# Patient Record
Sex: Female | Born: 1960
Health system: Southern US, Community
[De-identification: ages and names within clinical notes are randomized; demographics above are authoritative.]

## PROBLEM LIST (undated history)

## (undated) ENCOUNTER — Ambulatory Visit (HOSPITAL_COMMUNITY): Admission: EM | Payer: Commercial Managed Care - HMO

## (undated) DIAGNOSIS — F329 Major depressive disorder, single episode, unspecified: Secondary | ICD-10-CM

## (undated) DIAGNOSIS — F32A Depression, unspecified: Secondary | ICD-10-CM

## (undated) HISTORY — DX: Depression, unspecified: F32.A

---
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
## 1993-08-25 HISTORY — PX: TUBAL LIGATION: SHX77

## 2008-07-04 ENCOUNTER — Ambulatory Visit: Payer: Self-pay | Admitting: Sports Medicine

## 2008-07-04 DIAGNOSIS — M25569 Pain in unspecified knee: Secondary | ICD-10-CM | POA: Insufficient documentation

## 2008-07-04 DIAGNOSIS — S336XXA Sprain of sacroiliac joint, initial encounter: Secondary | ICD-10-CM | POA: Insufficient documentation

## 2009-09-20 ENCOUNTER — Emergency Department (HOSPITAL_COMMUNITY): Admission: EM | Admit: 2009-09-20 | Discharge: 2009-09-20 | Payer: Self-pay | Admitting: Emergency Medicine

## 2009-10-01 ENCOUNTER — Ambulatory Visit (HOSPITAL_COMMUNITY): Admission: RE | Admit: 2009-10-01 | Discharge: 2009-10-01 | Payer: Self-pay | Admitting: Occupational Medicine

## 2011-03-10 ENCOUNTER — Inpatient Hospital Stay (INDEPENDENT_AMBULATORY_CARE_PROVIDER_SITE_OTHER)
Admission: RE | Admit: 2011-03-10 | Discharge: 2011-03-10 | Disposition: A | Discharge status: Home / Self Care | Payer: 59 | Source: Ambulatory Visit | Attending: Emergency Medicine | Admitting: Emergency Medicine

## 2011-03-10 DIAGNOSIS — R198 Other specified symptoms and signs involving the digestive system and abdomen: Secondary | ICD-10-CM

## 2011-03-10 DIAGNOSIS — M62838 Other muscle spasm: Secondary | ICD-10-CM

## 2011-03-10 DIAGNOSIS — F411 Generalized anxiety disorder: Secondary | ICD-10-CM

## 2011-12-30 ENCOUNTER — Encounter (HOSPITAL_COMMUNITY): Payer: Self-pay | Admitting: *Deleted

## 2011-12-30 ENCOUNTER — Emergency Department (HOSPITAL_COMMUNITY): Payer: Self-pay

## 2011-12-30 ENCOUNTER — Emergency Department (HOSPITAL_COMMUNITY)
Admission: EM | Admit: 2011-12-30 | Discharge: 2011-12-30 | Disposition: A | Discharge status: Home / Self Care | Payer: Self-pay | Attending: Emergency Medicine | Admitting: Emergency Medicine

## 2011-12-30 DIAGNOSIS — R109 Unspecified abdominal pain: Secondary | ICD-10-CM | POA: Insufficient documentation

## 2011-12-30 DIAGNOSIS — N949 Unspecified condition associated with female genital organs and menstrual cycle: Secondary | ICD-10-CM | POA: Insufficient documentation

## 2011-12-30 DIAGNOSIS — R10814 Left lower quadrant abdominal tenderness: Secondary | ICD-10-CM | POA: Insufficient documentation

## 2011-12-30 DIAGNOSIS — R42 Dizziness and giddiness: Secondary | ICD-10-CM | POA: Insufficient documentation

## 2011-12-30 LAB — CBC
HCT: 39.4 % (ref 36.0–46.0)
Hemoglobin: 13.3 g/dL (ref 12.0–15.0)
MCH: 30.4 pg (ref 26.0–34.0)
MCV: 90 fL (ref 78.0–100.0)
WBC: 9.2 10*3/uL (ref 4.0–10.5)

## 2011-12-30 LAB — DIFFERENTIAL
Eosinophils Absolute: 0.5 10*3/uL (ref 0.0–0.7)
Eosinophils Relative: 6 % — ABNORMAL HIGH (ref 0–5)
Lymphocytes Relative: 25 % (ref 12–46)
Lymphs Abs: 2.3 10*3/uL (ref 0.7–4.0)
Neutro Abs: 5.6 10*3/uL (ref 1.7–7.7)

## 2011-12-30 LAB — GLUCOSE, CAPILLARY

## 2011-12-30 LAB — BASIC METABOLIC PANEL
CO2: 27 mEq/L (ref 19–32)
Calcium: 8.7 mg/dL (ref 8.4–10.5)
Chloride: 102 mEq/L (ref 96–112)
GFR calc Af Amer: >90 mL/min (ref >90)
Glucose, Bld: 97 mg/dL (ref 70–99)
Potassium: 4.1 mEq/L (ref 3.5–5.1)
Sodium: 138 mEq/L (ref 135–145)

## 2011-12-30 LAB — URINALYSIS, ROUTINE W REFLEX MICROSCOPIC
Bilirubin Urine: NEGATIVE
Hgb urine dipstick: NEGATIVE
Ketones, ur: NEGATIVE mg/dL
Nitrite: NEGATIVE
Protein, ur: NEGATIVE mg/dL
Specific Gravity, Urine: 1.02 (ref 1.005–1.030)
pH: 7.5 (ref 5.0–8.0)

## 2011-12-30 MED ORDER — HYDROCODONE-ACETAMINOPHEN 5-500 MG PO TABS
1.0000 | ORAL_TABLET | Freq: Four times a day (QID) | ORAL | Status: AC | PRN
Start: 2011-12-30 — End: 2012-01-09

## 2011-12-30 MED ORDER — SODIUM CHLORIDE 0.9 % IV BOLUS (SEPSIS)
1000.0000 mL | Freq: Once | INTRAVENOUS | Status: AC
  Administered 2011-12-30: 500 mL via INTRAVENOUS

## 2011-12-30 MED ORDER — FENTANYL CITRATE 0.05 MG/ML IJ SOLN
50.0000 ug | Freq: Once | INTRAMUSCULAR | Status: AC
  Administered 2011-12-30: 50 ug via INTRAVENOUS
  Filled 2011-12-30: qty 2

## 2011-12-30 NOTE — ED Notes (Addendum)
Pt in c/o intermittent dizziness over last year, worse recently and states she has been very fatigued, weakness, increased urination, pain with urination and left flank pain

## 2011-12-30 NOTE — Discharge Instructions (Signed)
Alternate ibuprofen and hydrocodone-acetaminophen as needed for pain. Do not drive or operate machinery with hydrocodone-acetaminophen use. It is very important to followup with primary care provider for further evaluation and management of recurrent flank pain as well as for ongoing dizziness. Return to emergency department for any emergent changing or worsening of symptoms.  Flank Pain Flank pain is pain in your side.  HOME CARE  Home care and treatment will depend on the cause of your pain.   Some medicines can help relieve the pain. Take all medicine as told by your doctor.   Tell your doctor about any changes in your pain.   Follow up with your doctor.  GET HELP RIGHT AWAY IF:   Your pain does not get better with medicine.   The pain gets worse.   You have belly (abdominal) pain.   You are short of breath.   You always feel sick to your stomach (nauseous).   You keep throwing up (vomiting).   You have puffiness (swelling) in your belly.   You pass out (faint).   You have a temperature by mouth above 102 F (38.9 C), not controlled by medicine.  MAKE SURE YOU:   Understand these instructions.   Will watch your condition.   Will get help right away if you are not doing well or get worse.  Document Released: 05/20/2008 Document Revised: 07/31/2011 Document Reviewed: 01/26/2010 [facility] Patient Information 2012 [place], [place].

## 2011-12-30 NOTE — ED Provider Notes (Signed)
History     CSN: [id number]  Arrival date & time 12/30/11  1533   First MD Initiated Contact with Patient 12/30/11 1708      Chief Complaint  Patient presents with  . Dizziness  . Flank Pain    (Consider location/radiation/quality/duration/timing/severity/associated sxs/prior treatment) HPI  Patient presents to ER complaining of a multiple month hx of lightheadedness that she states is worse with quick movement as well as a few day hx of increasing fatigue and mild painful urination stating "it burns a little when I pee" as well as a few day hx of intermittent LLQ/flank pain. Patient states she went to an [facility] last week and started on metronidazole for abdominal pain. Pateitn states she has taken tramadol for pain with some relief of pain. She states she has no known medical problems and takes no meds on a regular basis. She denies hx of abdominal surgeries. She denies known fevers, chills, HA, difficulty ambulating, loss of coordination, blood in stool, hematuria, CP, SOB, n/v/d, vaginal d/c.    History reviewed. No pertinent past medical history.  History reviewed. No pertinent past surgical history.  History reviewed. No pertinent family history.  History  Substance Use Topics  . Smoking status: Not on file  . Smokeless tobacco: Not on file  . Alcohol Use: Not on file    OB History    Grav Para Term Preterm Abortions TAB SAB Ect Mult Living                  Review of Systems  All other systems reviewed and are negative.    Allergies  Review of patient's allergies indicates no known allergies.  Home Medications   Current Outpatient Rx  Name Route Sig Dispense Refill  . IBUPROFEN 400 MG PO TABS Oral Take 400 mg by mouth every 6 (six) hours as needed. For pain relief    . ADULT MULTIVITAMIN W/MINERALS CH Oral Take 1 tablet by mouth daily.    . TRAMADOL HCL 50 MG PO TABS Oral Take 50 mg by mouth every 6 (six) hours as needed. For pain.       BP 110/81  Pulse 86   Temp(Src) 98.5 F (36.9 C) (Oral)  Resp 20  SpO2 100%  LMP 12/08/2011  Physical Exam  Nursing note and vitals reviewed. Constitutional: She is oriented to person, place, and time. She appears well-developed and well-nourished. No distress.  HENT:  Head: Normocephalic and atraumatic.  Eyes: Conjunctivae and EOM are normal. Pupils are equal, round, and reactive to light.       No nystagmus.   Neck: Normal range of motion. Neck supple.       No bruit  Cardiovascular: Normal rate, regular rhythm, normal heart sounds and intact distal pulses.  Exam reveals no gallop and no friction rub.   No murmur heard. Pulmonary/Chest: Effort normal and breath sounds normal. No respiratory distress. She has no wheezes. She has no rales. She exhibits no tenderness.  Abdominal: Soft. Bowel sounds are normal. She exhibits no distension and no mass. There is tenderness. There is no rebound and no guarding.       Mld TTP of LLQ and mild left CVA TTP but no rigidity or peritoneal signs. No skin changes or rash.   Musculoskeletal: Normal range of motion. She exhibits no edema and no tenderness.  Neurological: She is alert and oriented to person, place, and time. No cranial nerve deficit. Coordination normal.  Skin: Skin is warm and dry.  No rash noted. She is not diaphoretic. No erythema.  Psychiatric: She has a normal mood and affect.    ED Course  Procedures (including critical care time)  IV fluids and fetanyl.   Labs Reviewed  GLUCOSE, CAPILLARY - Abnormal; Notable for the following:    Glucose-Capillary 115 (*)    All other components within normal limits  DIFFERENTIAL - Abnormal; Notable for the following:    Eosinophils Relative 6 (*)    All other components within normal limits  GLUCOSE, CAPILLARY - Abnormal; Notable for the following:    Glucose-Capillary 100 (*)    All other components within normal limits  URINALYSIS, ROUTINE W REFLEX MICROSCOPIC  CBC  BASIC METABOLIC PANEL   Ct Abdomen  Pelvis Wo Contrast  12/30/2011  *RADIOLOGY REPORT*  Clinical Data: Left flank pain.  Pelvic pain.  CT ABDOMEN AND PELVIS WITHOUT CONTRAST  Technique:  Multidetector CT imaging of the abdomen and pelvis was performed following the standard protocol without intravenous contrast.  Comparison: None.  Findings: Lung bases show dependent atelectasis. Unenhanced CT was performed per clinician order.  Lack of IV contrast limits sensitivity and specificity, especially for evaluation of abdominal/pelvic solid viscera.  The liver appears within normal limits.  Spleen normal.  Adrenal glands normal.  There are no renal stones.  Both ureters appear within normal limits.  The uterus appears normal.  There is a cystic lesion in the right adnexa with low attenuation measuring 33 mm x 40 3 mm.  This probably represents an ovarian cyst.  6-week follow up transvaginal ultrasound recommended to assess for resolution or further define. Bilateral tubal ligation clips are present.  Please note technologist states no history of prior abdominal surgery. Clinically correlate.  The small bowel appears within normal limits.  Mild respiratory motion artifact is present on the examination.  Colon and rectum appear normal.  No aggressive osseous lesions.  IMPRESSION:  1.  Negative for urolithiasis.  No acute abnormality on noncontrast CT.  Normal appendix. 2.  Probable tubal ligation.  Clinically correlate. 3.  33 mm x 43 mm cystic right adnexal lesion, probably representing hemorrhagic ovarian cyst.  6-week follow-up pelvic ultrasound recommended to assess for resolution.  Original Report Authenticated By: Andreas Newport, M.D.     1. Flank pain   2. Lightheadedness       MDM  No acute findings on labs with patient complaining of a year long hx of lightheadedness without significant lab findings and patient ambulating without difficulty with no neurofocal findings and no ataxia. Non acute abdomen with no signs or UTI. Patient given  strict precautions for return to ER otherwise the need for PCP follow up for further evaluation and mangement.         Jenness Corner, [place] 12/30/11 2013

## 2011-12-30 NOTE — ED Provider Notes (Signed)
Medical screening examination/treatment/procedure(s) were performed by non-physician practitioner and as supervising physician I was immediately available for consultation/collaboration.   Lyanne Co, MD 12/30/11 2014

## 2012-11-02 ENCOUNTER — Encounter (HOSPITAL_COMMUNITY): Payer: Self-pay | Admitting: Emergency Medicine

## 2012-11-02 ENCOUNTER — Emergency Department (HOSPITAL_COMMUNITY)
Admission: EM | Admit: 2012-11-02 | Discharge: 2012-11-02 | Disposition: A | Discharge status: Home / Self Care | Payer: 59 | Source: Home / Self Care | Attending: Emergency Medicine | Admitting: Emergency Medicine

## 2012-11-02 DIAGNOSIS — H6121 Impacted cerumen, right ear: Secondary | ICD-10-CM

## 2012-11-02 DIAGNOSIS — K044 Acute apical periodontitis of pulpal origin: Secondary | ICD-10-CM

## 2012-11-02 DIAGNOSIS — K047 Periapical abscess without sinus: Secondary | ICD-10-CM

## 2012-11-02 DIAGNOSIS — H6120 Impacted cerumen, unspecified ear: Secondary | ICD-10-CM

## 2012-11-02 MED ORDER — NAPROXEN 500 MG PO TABS: 500.0000 mg | ORAL_TABLET | Freq: Two times a day (BID) | ORAL | Status: DC

## 2012-11-02 MED ORDER — HYDROCODONE-ACETAMINOPHEN 5-325 MG PO TABS: ORAL_TABLET | ORAL | Status: DC

## 2012-11-02 MED ORDER — CLINDAMYCIN HCL 300 MG PO CAPS: 300.0000 mg | ORAL_CAPSULE | Freq: Four times a day (QID) | ORAL | Status: DC

## 2012-11-02 NOTE — ED Notes (Signed)
Pt called no answer 

## 2012-11-02 NOTE — ED Notes (Signed)
Patient moved to another room for use of equipment.  Discharge pending reevaluation of ear

## 2012-11-02 NOTE — ED Notes (Signed)
Jaw pain.  Patient reports pain, redness right lower jaw.  Pain onset last Friday, and reports 2 teeth.  Has appt with dentist  this friday

## 2012-11-02 NOTE — ED Provider Notes (Signed)
Chief Complaint  Patient presents with  . 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 She presents today with a four-day history of pain in her right jaw. She has 2 sensitivity, and is rated 10 over 10 in intensity. It radiates into her ear and her neck. At times she's had some swelling and erythema along the jawline. It hurts to chew and open her mouth. She has no difficulty breathing or swallowing. No shortness of breath, fever, or chest pain.  Review of Systems:  Other than noted above, the patient denies any of the following symptoms: Systemic:  No fever, chills, sweats or weight loss. ENT:  No headache, ear ache, sore throat, nasal congestion, facial pain, or swelling. Lymphatic:  No adenopathy. Lungs:  No coughing, wheezing or shortness of breath.  PMFSH:  Past medical history, family history, social history, meds, and allergies were reviewed.  Physical Exam:   Vital signs:  BP 132/73  Pulse 75  Temp(Src) 97.6 F (36.4 C) (Oral)  Resp 16  SpO2 100%  LMP 12/08/2011 General:  Alert, oriented, in no distress. ENT:  TMs and canals normal.  Nasal mucosa normal. Mouth exam:  Her right, lower, first bicuspid was tender to palpation. There is some swelling and erythema of the gingiva no collection of pus. There is no visible cavity. No swelling of the floor the mouth. The pharynx is clear. Neck:  No swelling or adenopathy. Lungs:  Breath sounds clear and equal bilaterally.  No wheezes, rales or rhonchi. Heart:  Regular rhythm.  No gallops or murmers. Skin:  Clear, warm and dry.  Assessment:  The primary encounter diagnosis was Dental infection. A diagnosis of Impacted cerumen, right was also pertinent to this visit.  Plan:   1.  The following meds were prescribed:   Discharge Medication List as of 11/02/2012 12:43 PM    START taking these medications   Details  clindamycin (CLEOCIN) 300 MG  capsule Take 1 capsule (300 mg total) by mouth 4 (four) times daily., Starting 11/02/2012, Until Discontinued, Normal    HYDROcodone-acetaminophen (NORCO/VICODIN) 5-325 MG per tablet 1 to 2 tabs every 4 to 6 hours as needed for pain., Print    naproxen (NAPROSYN) 500 MG tablet Take 1 tablet (500 mg total) by mouth 2 (two) times daily., Starting 11/02/2012, Until Discontinued, Normal       2.  The patient was instructed in symptomatic care and handouts were given. 3.  The patient was told to return if becoming worse in any way, if no better in 3 or 4 days, and given some red flag symptoms that would indicate earlier return, especially difficulty breathing. 4.  The patient was told to follow up with a dentist as soon as possible.    Reuben Likes, MD 11/02/12 2157

## 2012-11-02 NOTE — ED Notes (Signed)
Not in treatment room 

## 2012-12-04 ENCOUNTER — Ambulatory Visit (INDEPENDENT_AMBULATORY_CARE_PROVIDER_SITE_OTHER): Payer: 59 | Admitting: Family Medicine

## 2012-12-04 ENCOUNTER — Ambulatory Visit: Payer: Self-pay

## 2012-12-04 VITALS — BP 118/80 | HR 75 | Temp 98.2°F | Resp 16 | Ht 62.5 in | Wt 147.0 lb

## 2012-12-04 DIAGNOSIS — R21 Rash and other nonspecific skin eruption: Secondary | ICD-10-CM

## 2012-12-04 DIAGNOSIS — R079 Chest pain, unspecified: Secondary | ICD-10-CM

## 2012-12-04 MED ORDER — TRIAMCINOLONE ACETONIDE 0.1 % EX CREA: TOPICAL_CREAM | Freq: Two times a day (BID) | CUTANEOUS | Status: DC

## 2012-12-04 NOTE — Progress Notes (Signed)
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  She has had 10 years of rash (hyperpigmentation) on face, chest, and feet.  She also has ecchymotic areas with burning on legs. Divorced x 6 years.  Not sexually active  She presents with intermittent chest pains, shortness of breath, and headaches which are brief, intermittent (several times a month).  The symptoms are unpredictable, and scary.  Patient is under a great deal of stress.  objective: Very pleasant woman in no acute distress, smiling with good eye contact. Interview conducted in [place]. HEENT: Unremarkable Chest: Clear Heart: Regular no murmur, gallop, rub EKG: No acute changes Extremities: No edema or tenderness and calves. Patient does have an ecchymotic area on her left medial calf. She is hyperpigmentation and dry skin on both dorsal forearms, her upper chest in the exposed area to son, and her cheeks.  Assessment:  She is hyperpigmentation in areas that may represent a contact dermatitis chronically. Her chest pain is most likely a consequence of her stress.  Plan: Splint the patient that her rash was not related to fat in her veins as she initially insisted. Aref we're performing lab tests which should be back Monday after which we can formulate a plan. I've asked her to schedule appointment when she's off so we can finished and physical. Chest pain, unspecified - Plan: EKG 12-Lead, CBC with Differential, RPR, Lipid panel, TSH, Comprehensive metabolic panel, ANA, Sedimentation Rate, CANCELED: POCT SEDIMENTATION RATE  Chest pain - Plan: CBC with Differential, RPR, Lipid panel, TSH, Comprehensive metabolic panel, ANA, Sedimentation Rate, CANCELED: POCT SEDIMENTATION RATE  Rash and nonspecific skin eruption - Plan: CBC with Differential, RPR, Lipid panel, TSH, Comprehensive metabolic panel, triamcinolone cream (KENALOG) 0.1 %, Sedimentation Rate, CANCELED: POCT SEDIMENTATION RATE

## 2012-12-05 ENCOUNTER — Encounter: Payer: Self-pay | Admitting: *Deleted

## 2012-12-05 LAB — COMPREHENSIVE METABOLIC PANEL
ALT: 21 U/L (ref 0–35)
AST: 19 U/L (ref 0–37)
Albumin: 4.4 g/dL (ref 3.5–5.2)
Alkaline Phosphatase: 67 U/L (ref 39–117)
BUN: 14 mg/dL (ref 6–23)
CO2: 31 mEq/L (ref 19–32)
Calcium: 9.4 mg/dL (ref 8.4–10.5)
Chloride: 103 mEq/L (ref 96–112)
Creat: 0.82 mg/dL (ref 0.50–1.10)
Glucose, Bld: 90 mg/dL (ref 70–99)
Potassium: 4.6 mEq/L (ref 3.5–5.3)
Sodium: 138 mEq/L (ref 135–145)
Total Bilirubin: 0.4 mg/dL (ref 0.3–1.2)
Total Protein: 7 g/dL (ref 6.0–8.3)

## 2012-12-05 LAB — CBC WITH DIFFERENTIAL/PLATELET
Basophils Absolute: 0.1 10*3/uL (ref 0.0–0.1)
Basophils Relative: 1 % (ref 0–1)
Eosinophils Absolute: 0.5 10*3/uL (ref 0.0–0.7)
Eosinophils Relative: 7 % — ABNORMAL HIGH (ref 0–5)
HCT: 41.6 % (ref 36.0–46.0)
Hemoglobin: 14.1 g/dL (ref 12.0–15.0)
Lymphocytes Relative: 32 % (ref 12–46)
Lymphs Abs: 2.1 10*3/uL (ref 0.7–4.0)
MCH: 30.1 pg (ref 26.0–34.0)
MCHC: 33.9 g/dL (ref 30.0–36.0)
MCV: 88.9 fL (ref 78.0–100.0)
Monocytes Absolute: 0.5 10*3/uL (ref 0.1–1.0)
Monocytes Relative: 8 % (ref 3–12)
Neutro Abs: 3.3 10*3/uL (ref 1.7–7.7)
Neutrophils Relative %: 52 % (ref 43–77)
Platelets: 214 10*3/uL (ref 150–400)
RBC: 4.68 MIL/uL (ref 3.87–5.11)
RDW: 13.8 % (ref 11.5–15.5)
WBC: 6.4 10*3/uL (ref 4.0–10.5)

## 2012-12-05 LAB — LIPID PANEL
Cholesterol: 179 mg/dL (ref 0–200)
HDL: 54 mg/dL (ref >39)
LDL Cholesterol: 98 mg/dL (ref 0–99)
Total CHOL/HDL Ratio: 3.3 Ratio
Triglycerides: 136 mg/dL (ref <150)
VLDL: 27 mg/dL (ref 0–40)

## 2012-12-05 LAB — SEDIMENTATION RATE: Sed Rate: 1 mm/hr (ref 0–22)

## 2012-12-05 LAB — RPR

## 2012-12-05 LAB — TSH: TSH: 1.877 u[IU]/mL (ref 0.350–4.500)

## 2012-12-06 LAB — ANA: Anti Nuclear Antibody(ANA): NEGATIVE

## 2013-01-16 ENCOUNTER — Emergency Department (HOSPITAL_COMMUNITY): Payer: 59

## 2013-01-16 ENCOUNTER — Emergency Department (HOSPITAL_COMMUNITY)
Admission: EM | Admit: 2013-01-16 | Discharge: 2013-01-16 | Disposition: A | Discharge status: Home / Self Care | Payer: 59 | Attending: Emergency Medicine | Admitting: Emergency Medicine

## 2013-01-16 ENCOUNTER — Encounter (HOSPITAL_COMMUNITY): Payer: Self-pay | Admitting: *Deleted

## 2013-01-16 DIAGNOSIS — M546 Pain in thoracic spine: Secondary | ICD-10-CM | POA: Insufficient documentation

## 2013-01-16 DIAGNOSIS — R112 Nausea with vomiting, unspecified: Secondary | ICD-10-CM | POA: Insufficient documentation

## 2013-01-16 DIAGNOSIS — K2970 Gastritis, unspecified, without bleeding: Secondary | ICD-10-CM

## 2013-01-16 DIAGNOSIS — R079 Chest pain, unspecified: Secondary | ICD-10-CM | POA: Insufficient documentation

## 2013-01-16 DIAGNOSIS — K2900 Acute gastritis without bleeding: Secondary | ICD-10-CM | POA: Insufficient documentation

## 2013-01-16 DIAGNOSIS — Z79899 Other long term (current) drug therapy: Secondary | ICD-10-CM | POA: Insufficient documentation

## 2013-01-16 LAB — CBC WITH DIFFERENTIAL/PLATELET
Basophils Absolute: 0.1 10*3/uL (ref 0.0–0.1)
Basophils Relative: 1 % (ref 0–1)
Hemoglobin: 14 g/dL (ref 12.0–15.0)
MCHC: 33.6 g/dL (ref 30.0–36.0)
Monocytes Relative: 6 % (ref 3–12)
Neutro Abs: 4.9 10*3/uL (ref 1.7–7.7)
Neutrophils Relative %: 61 % (ref 43–77)
RDW: 13.2 % (ref 11.5–15.5)

## 2013-01-16 LAB — URINALYSIS, ROUTINE W REFLEX MICROSCOPIC
Bilirubin Urine: NEGATIVE
Hgb urine dipstick: NEGATIVE
Nitrite: NEGATIVE
Specific Gravity, Urine: 1.014 (ref 1.005–1.030)
pH: 5 (ref 5.0–8.0)

## 2013-01-16 LAB — COMPREHENSIVE METABOLIC PANEL
ALT: 12 U/L (ref 0–35)
AST: 16 U/L (ref 0–37)
Albumin: 3.7 g/dL (ref 3.5–5.2)
Alkaline Phosphatase: 71 U/L (ref 39–117)
Chloride: 102 mEq/L (ref 96–112)
Potassium: 4.2 mEq/L (ref 3.5–5.1)
Total Bilirubin: 0.2 mg/dL — ABNORMAL LOW (ref 0.3–1.2)

## 2013-01-16 LAB — URINE MICROSCOPIC-ADD ON

## 2013-01-16 MED ORDER — ONDANSETRON 4 MG PO TBDP: ORAL_TABLET | ORAL | Status: DC

## 2013-01-16 MED ORDER — MORPHINE SULFATE 4 MG/ML IJ SOLN
4.0000 mg | Freq: Once | INTRAMUSCULAR | Status: AC
  Administered 2013-01-16: 4 mg via INTRAVENOUS
  Filled 2013-01-16: qty 1

## 2013-01-16 MED ORDER — GI COCKTAIL ~~LOC~~
30.0000 mL | Freq: Once | ORAL | Status: AC
  Administered 2013-01-16: 30 mL via ORAL
  Filled 2013-01-16: qty 30

## 2013-01-16 MED ORDER — PANTOPRAZOLE SODIUM 40 MG IV SOLR
40.0000 mg | Freq: Once | INTRAVENOUS | Status: AC
  Administered 2013-01-16: 40 mg via INTRAVENOUS
  Filled 2013-01-16: qty 40

## 2013-01-16 MED ORDER — LANSOPRAZOLE 30 MG PO CPDR: 30.0000 mg | DELAYED_RELEASE_CAPSULE | Freq: Every day | ORAL | Status: DC

## 2013-01-16 MED ORDER — SODIUM CHLORIDE 0.9 % IV BOLUS (SEPSIS)
1000.0000 mL | Freq: Once | INTRAVENOUS | Status: AC
  Administered 2013-01-16: 1000 mL via INTRAVENOUS

## 2013-01-16 MED ORDER — ONDANSETRON HCL 4 MG/2ML IJ SOLN
4.0000 mg | Freq: Once | INTRAMUSCULAR | Status: AC
  Administered 2013-01-16: 4 mg via INTRAVENOUS
  Filled 2013-01-16: qty 2

## 2013-01-16 MED ORDER — ESOMEPRAZOLE MAGNESIUM 40 MG PO CPDR: 40.0000 mg | DELAYED_RELEASE_CAPSULE | Freq: Every day | ORAL | Status: DC

## 2013-01-16 NOTE — ED Notes (Signed)
Pt discharged home. Encouraged to follow up with PCP. Vital signs stable.

## 2013-01-16 NOTE — ED Notes (Signed)
Reports not feeling well this am. Having upper back pain, then epigastric pain and n/v. ekg done at triage.

## 2013-01-16 NOTE — ED Notes (Signed)
MD at bedside. 

## 2013-01-16 NOTE — ED Provider Notes (Signed)
History     CSN: [id number]  Arrival date & time 01/16/13  0915   First MD Initiated Contact with Patient 01/16/13 0957      Chief Complaint  Patient presents with  . Back Pain  . Emesis  . Abdominal Pain    (Consider location/radiation/quality/duration/timing/severity/associated sxs/prior treatment) HPI Pt states that she drank a smoothie this morning and started having epigastric pain that radiated up to chest and into upper back. +nausea and vomiting. No recent melena or blood in stool. No urinary symptoms. Pt states she has been diagnosed with GERD in the past. No abd surgeries other than C section.  History reviewed. No pertinent past medical history.  Past Surgical History  Procedure Laterality Date  . Cesarean section      History reviewed. No pertinent family history.  History  Substance Use Topics  . Smoking status: Never Smoker   . Smokeless tobacco: Not on file  . Alcohol Use: No    OB History   Grav Para Term Preterm Abortions TAB SAB Ect Mult Living                  Review of Systems  Constitutional: Negative for fever and chills.  Respiratory: Negative for shortness of breath.   Cardiovascular: Positive for chest pain. Negative for palpitations and leg swelling.  Gastrointestinal: Positive for nausea, vomiting and abdominal pain. Negative for diarrhea, constipation and blood in stool.  Genitourinary: Negative for dysuria, hematuria and flank pain.  Musculoskeletal: Positive for back pain.  Skin: Negative for rash and wound.  Neurological: Negative for dizziness, weakness, light-headedness, numbness and headaches.  All other systems reviewed and are negative.    Allergies  Review of patient's allergies indicates no known allergies.  Home Medications   Current Outpatient Rx  Name  Route  Sig  Dispense  Refill  . traMADol (ULTRAM) 50 MG tablet   Oral   Take 50 mg by mouth every 6 (six) hours as needed. For pain.          Marland Kitchen triamcinolone  cream (KENALOG) 0.1 %   Topical   Apply topically 2 (two) times daily.   454 g   1     BP 109/65  Pulse 57  Temp(Src) 97.5 F (36.4 C) (Oral)  Resp 19  SpO2 99%  LMP 12/08/2011  Physical Exam  Nursing note and vitals reviewed. Constitutional: She is oriented to person, place, and time. She appears well-developed and well-nourished. No distress.  HENT:  Head: Normocephalic and atraumatic.  Mouth/Throat: Oropharynx is clear and moist.  Eyes: EOM are normal. Pupils are equal, round, and reactive to light.  Neck: Normal range of motion. Neck supple.  Cardiovascular: Normal rate and regular rhythm.   Pulmonary/Chest: Effort normal and breath sounds normal. No respiratory distress. She has no wheezes. She has no rales. She exhibits no tenderness.  Abdominal: Soft. Bowel sounds are normal. She exhibits no distension and no mass. There is tenderness (RUQ/epigastric TTP. ). There is no rebound and no guarding.  Musculoskeletal: Normal range of motion. She exhibits tenderness (no focal back tenderness, No CVAT). She exhibits no edema.  Neurological: She is alert and oriented to person, place, and time.  Skin: Skin is warm and dry. No rash noted. No erythema.  Psychiatric: She has a normal mood and affect. Her behavior is normal.    ED Course  Procedures (including critical care time)  Labs Reviewed  CBC WITH DIFFERENTIAL - Abnormal; Notable for the following:  Eosinophils Relative 6 (*)    All other components within normal limits  COMPREHENSIVE METABOLIC PANEL - Abnormal; Notable for the following:    Glucose, Bld 109 (*)    Total Bilirubin 0.2 (*)    All other components within normal limits  URINALYSIS, ROUTINE W REFLEX MICROSCOPIC - Abnormal; Notable for the following:    APPearance CLOUDY (*)    Leukocytes, UA MODERATE (*)    All other components within normal limits  URINE MICROSCOPIC-ADD ON - Abnormal; Notable for the following:    Squamous Epithelial / LPF FEW (*)     Bacteria, UA FEW (*)    Crystals URIC ACID CRYSTALS (*)    All other components within normal limits  URINE CULTURE  LIPASE, BLOOD  TROPONIN I   US Abdomen Complete  01/16/2013   *RADIOLOGY REPORT*  Clinical Data:  Right upper quadrant pain  ABDOMINAL ULTRASOUND COMPLETE  Comparison:  None.  Findings:  Gallbladder:  No gallstones, gallbladder wall thickening, or pericholecystic fluid. Trace sludge is present in the gallbladder.  Common Bile Duct:  Within normal limits in caliber.  Liver: No focal mass lesion identified.  Within normal limits in parenchymal echogenicity.  IVC:  Appears normal.  Pancreas:  No abnormality identified.  Spleen:  Within normal limits in size and echotexture.  Right kidney:  Normal in size and parenchymal echogenicity.  No evidence of mass or hydronephrosis.  Left kidney:  Normal in size and parenchymal echogenicity.  No evidence of mass or hydronephrosis.  Abdominal Aorta:  No aneurysm identified.  IMPRESSION: No acute intra-abdominal pathology.   Original Report Authenticated By: Jolaine Click, M.D.     1. Gastritis      Date: 01/16/2013  Rate: 66  Rhythm: normal sinus rhythm  QRS Axis: normal  Intervals: normal  ST/T Wave abnormalities: normal  Conduction Disutrbances:none  Narrative Interpretation:   Old EKG Reviewed: none available    MDM  Pt states she is feeling better. She admits to recent ibuprofen use. Will start on PPI and advise f/u with GI. Pt advised to stop taking all NSAID's. Doubt CAD. Normal EKG/Trop. Return precautions given.         Loren Racer, MD 01/16/13 [phone]

## 2013-01-19 LAB — URINE CULTURE

## 2013-07-08 ENCOUNTER — Other Ambulatory Visit: Payer: Self-pay

## 2013-11-02 ENCOUNTER — Ambulatory Visit: Payer: 59

## 2013-11-02 ENCOUNTER — Ambulatory Visit (INDEPENDENT_AMBULATORY_CARE_PROVIDER_SITE_OTHER): Payer: 59 | Admitting: Family Medicine

## 2013-11-02 DIAGNOSIS — R1013 Epigastric pain: Secondary | ICD-10-CM

## 2013-11-02 DIAGNOSIS — R141 Gas pain: Secondary | ICD-10-CM

## 2013-11-02 DIAGNOSIS — R140 Abdominal distension (gaseous): Secondary | ICD-10-CM

## 2013-11-02 DIAGNOSIS — R142 Eructation: Secondary | ICD-10-CM

## 2013-11-02 DIAGNOSIS — R143 Flatulence: Secondary | ICD-10-CM

## 2013-11-02 LAB — POCT URINALYSIS DIPSTICK
Bilirubin, UA: NEGATIVE
Blood, UA: NEGATIVE
Glucose, UA: NEGATIVE
Ketones, UA: NEGATIVE
Leukocytes, UA: NEGATIVE
Nitrite, UA: NEGATIVE
Protein, UA: NEGATIVE
Spec Grav, UA: 1.015
Urobilinogen, UA: 0.2
pH, UA: 5.5

## 2013-11-02 LAB — POCT UA - MICROSCOPIC ONLY
Bacteria, U Microscopic: NEGATIVE
Casts, Ur, LPF, POC: NEGATIVE
Crystals, Ur, HPF, POC: NEGATIVE
Mucus, UA: NEGATIVE
Yeast, UA: NEGATIVE

## 2013-11-02 LAB — COMPREHENSIVE METABOLIC PANEL
ALT: 14 U/L (ref 0–35)
AST: 16 U/L (ref 0–37)
Albumin: 4.3 g/dL (ref 3.5–5.2)
Alkaline Phosphatase: 71 U/L (ref 39–117)
BUN: 8 mg/dL (ref 6–23)
CO2: 30 mEq/L (ref 19–32)
Calcium: 9.6 mg/dL (ref 8.4–10.5)
Chloride: 104 mEq/L (ref 96–112)
Creat: 0.7 mg/dL (ref 0.50–1.10)
Glucose, Bld: 96 mg/dL (ref 70–99)
Potassium: 4.8 mEq/L (ref 3.5–5.3)
Sodium: 142 mEq/L (ref 135–145)
Total Bilirubin: 0.5 mg/dL (ref 0.2–1.2)
Total Protein: 6.9 g/dL (ref 6.0–8.3)

## 2013-11-02 LAB — POCT CBC
Granulocyte percent: 55.2 %G (ref 37–80)
HCT, POC: 42.4 % (ref 37.7–47.9)
Hemoglobin: 13.3 g/dL (ref 12.2–16.2)
Lymph, poc: 2 (ref 0.6–3.4)
MCH, POC: 29 pg (ref 27–31.2)
MCHC: 31.4 g/dL — AB (ref 31.8–35.4)
MCV: 92.6 fL (ref 80–97)
MID (cbc): 0.6 (ref 0–0.9)
MPV: 13.9 fL (ref 0–99.8)
POC Granulocyte: 3.1 (ref 2–6.9)
POC LYMPH PERCENT: 34.4 %L (ref 10–50)
POC MID %: 10.4 %M (ref 0–12)
Platelet Count, POC: 185 10*3/uL (ref 142–424)
RBC: 4.58 M/uL (ref 4.04–5.48)
RDW, POC: 13.6 %
WBC: 5.7 10*3/uL (ref 4.6–10.2)

## 2013-11-02 MED ORDER — POLYETHYLENE GLYCOL 3350 17 GM/SCOOP PO POWD: 17.0000 g | Freq: Two times a day (BID) | ORAL | Status: DC | PRN

## 2013-11-02 NOTE — Progress Notes (Signed)
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  Symptoms worsen after eating, particularly ice cream or coffee, but also fruit and vegetables  Now having constant LLQ pain. Colonoscopy normal 9 years ago.  Nexium helps, but Ranitidine doesn't  Objective:   NAD HEENT:  Unremarkable Neck:  Supple, no thyromegaly Chest:  Clear Heart:  Reg, no murmur Abdomen:  Soft, diffuse mild tenderness with deep palpation, no HSM or mass Ext:  Good periph pulses, no edema, FROM x 4 Skin:  No rash Neuro:  No asymmetry.  [facility] reading (PRIMARY) by  Dr. Milus GlazierLauenstein:  KUB stool RUQ and BTL buttons  Results for orders placed in visit on 11/02/13  POCT CBC      Result Value Ref Range   WBC 5.7  4.6 - 10.2 K/uL   Lymph, poc 2.0  0.6 - 3.4   POC LYMPH PERCENT 34.4  10 - 50 %L   MID (cbc) 0.6  0 - 0.9   POC MID % 10.4  0 - 12 %M   POC Granulocyte 3.1  2 - 6.9   Granulocyte percent 55.2  37 - 80 %G   RBC 4.58  4.04 - 5.48 M/uL   Hemoglobin 13.3  12.2 - 16.2 g/dL   HCT, POC 54.042.4  98.137.7 - 47.9 %   MCV 92.6  80 - 97 fL   MCH, POC 29.0  27 - 31.2 pg   MCHC 31.4 (*) 31.8 - 35.4 g/dL   RDW, POC 19.113.6     Platelet Count, POC 185  142 - 424 K/uL   MPV 13.9  0 - 99.8 fL  POCT URINALYSIS DIPSTICK      Result Value Ref Range   Color, UA yellow     Clarity, UA clear     Glucose, UA neg     Bilirubin, UA neg     Ketones, UA neg     Spec Grav, UA 1.015     Blood, UA neg     pH, UA 5.5     Protein, UA neg     Urobilinogen, UA 0.2     Nitrite, UA neg     Leukocytes, UA Negative    POCT UA - MICROSCOPIC ONLY      Result Value Ref Range   WBC, Ur, HPF, POC 0-1     RBC, urine, microscopic 0-1     Bacteria, U Microscopic neg     Mucus, UA neg     Epithelial cells, urine per micros 0-2     Crystals, Ur, HPF, POC neg     Casts, Ur, LPF, POC neg     Yeast, UA neg     .Assessment:  Constipation  Abdominal bloating - Plan: DG Abd 1 View, POCT CBC, POCT  urinalysis dipstick, POCT UA - Microscopic Only, TSH, Comprehensive metabolic panel, Ambulatory referral to Gastroenterology  Abdominal pain, epigastric - Plan: DG Abd 1 View, POCT CBC, POCT urinalysis dipstick, POCT UA - Microscopic Only, TSH, Comprehensive metabolic panel, Ambulatory referral to Gastroenterology  Signed, Elvina SidleKurt Sabian Kuba, MD

## 2013-11-03 LAB — TSH: TSH: 1.217 u[IU]/mL (ref 0.350–4.500)

## 2014-01-04 ENCOUNTER — Encounter: Payer: Self-pay | Admitting: Family Medicine

## 2014-05-02 ENCOUNTER — Ambulatory Visit (INDEPENDENT_AMBULATORY_CARE_PROVIDER_SITE_OTHER): Payer: 59

## 2014-05-02 ENCOUNTER — Ambulatory Visit (INDEPENDENT_AMBULATORY_CARE_PROVIDER_SITE_OTHER): Payer: 59 | Admitting: Internal Medicine

## 2014-05-02 VITALS — BP 108/66 | HR 57 | Temp 97.8°F | Resp 20 | Ht 64.0 in | Wt 153.8 lb

## 2014-05-02 DIAGNOSIS — M545 Low back pain, unspecified: Secondary | ICD-10-CM

## 2014-05-02 DIAGNOSIS — M25579 Pain in unspecified ankle and joints of unspecified foot: Secondary | ICD-10-CM

## 2014-05-02 DIAGNOSIS — J301 Allergic rhinitis due to pollen: Secondary | ICD-10-CM

## 2014-05-02 LAB — POCT CBC
Granulocyte percent: 52.4 %G (ref 37–80)
HEMATOCRIT: 43.7 % (ref 37.7–47.9)
HEMOGLOBIN: 13.8 g/dL (ref 12.2–16.2)
Lymph, poc: 3.4 (ref 0.6–3.4)
MCH: 29.4 pg (ref 27–31.2)
MCHC: 31.7 g/dL — AB (ref 31.8–35.4)
MCV: 92.6 fL (ref 80–97)
MID (cbc): 0.7 (ref 0–0.9)
MPV: 10.2 fL (ref 0–99.8)
POC Granulocyte: 4.5 (ref 2–6.9)
POC LYMPH PERCENT: 39.2 %L (ref 10–50)
POC MID %: 8.4 % (ref 0–12)
Platelet Count, POC: 201 10*3/uL (ref 142–424)
RBC: 4.72 M/uL (ref 4.04–5.48)
RDW, POC: 14 %
WBC: 8.6 10*3/uL (ref 4.6–10.2)

## 2014-05-02 LAB — POCT SEDIMENTATION RATE: POCT SED RATE: 3 mm/hr (ref 0–22)

## 2014-05-02 MED ORDER — MELOXICAM 15 MG PO TABS: 15.0000 mg | ORAL_TABLET | Freq: Every day | ORAL | Status: DC

## 2014-05-02 MED ORDER — FLUTICASONE PROPIONATE 50 MCG/ACT NA SUSP: NASAL | Status: DC

## 2014-05-02 NOTE — Progress Notes (Signed)
Subjective:   Patient ID: Kathryn Lester, female    DOB: [birth year], 53 y.o.   MRN: [id number]  This chart was scribed for Kathryn Sia, MD by Milly Jakob, ED Scribe. The patient was seen in room 9. Patient's care was started at 7:29 PM.  HPI  HPI Comments: Kathryn Lester is a 53 y.o. female who presents to the Emergency Department complaining of constant aching pain in her hips, legs, and feet bilaterally. She additionally reports constant, severe pain in her lower back. She reports swollen, painful areas on her feet bilaterally. She reports difficulty sleeping due to her pain, and stiffness in the morning She reports that her pain is exacerbated by exertion and movement. She states that her work keeps her on her feet for hours at a time, and includes climbing up and down stairs. She denies a family history of arthritis. She denies taking an regular medications.   Also c/o Lester in tonsils that smell bad off and on 6 months--intermittent pnd from allergies  Review of Systems  Objective:  Physical Exam  Nursing note and vitals reviewed. Constitutional: She appears well-developed and well-nourished.  HENT:  Mouth/Throat: Oropharynx is clear and moist.  Boggy turbs  Eyes: Conjunctivae and EOM are normal. Pupils are equal, round, and reactive to light.  Neck: Normal range of motion. No thyromegaly present.  Cardiovascular: Normal rate, regular rhythm and normal heart sounds.   No murmur heard. Pulmonary/Chest: Effort normal and breath sounds normal.  Musculoskeletal:  She has tenderness to palpation over the entire lumbar area and both SI joints. SLR negative bilaterally at 90 degrees. External rotation of both hips creates pain in SI joint.  Both feet have prominent tender swelling in the proximal forefoot. Ankles have good ROM.  Lymphadenopathy:    She has no cervical adenopathy.  Skin: No rash noted.   [facility] reading (PRIMARY) by  Dr. Merla Riches?spondylolithesis  L5-S1  Results for orders placed in visit on 05/02/14  POCT CBC      Result Value Ref Range   WBC 8.6  4.6 - 10.2 K/uL   Lymph, poc 3.4  0.6 - 3.4   POC LYMPH PERCENT 39.2  10 - 50 %L   MID (cbc) 0.7  0 - 0.9   POC MID % 8.4  0 - 12 %M   POC Granulocyte 4.5  2 - 6.9   Granulocyte percent 52.4  37 - 80 %G   RBC 4.72  4.04 - 5.48 M/uL   Hemoglobin 13.8  12.2 - 16.2 g/dL   HCT, POC 62.1  30.8 - 47.9 %   MCV 92.6  80 - 97 fL   MCH, POC 29.4  27 - 31.2 pg   MCHC 31.7 (*) 31.8 - 35.4 g/dL   RDW, POC 65.7     Platelet Count, POC 201  142 - 424 K/uL   MPV 10.2  0 - 99.8 fL     Assessment & Plan:   I have completed the patient encounter in its entirety as documented by the scribe, with editing by me where necessary. Tylasia Fletchall P. Merla Riches, M.D.  Bilateral low back pain without sciatica - Plan: POCT CBC, POCT SEDIMENTATION RATE, Comprehensive metabolic panel, Rheumatoid factor, DG Lumbar Spine 2-3 Views  Pain in joint, ankle and foot, unspecified laterality  Kathryn Lester 2 to PND/AR   Ref Dr Althea Charon   ?need for PT vs mri to r/o compression/lithesis  ? Needs orthotics  Mobic for now flonase for throat to prevent  PND

## 2014-05-03 ENCOUNTER — Encounter: Payer: Self-pay | Admitting: Internal Medicine

## 2014-05-03 LAB — COMPREHENSIVE METABOLIC PANEL
ALBUMIN: 4.4 g/dL (ref 3.5–5.2)
ALT: 17 U/L (ref 0–35)
AST: 14 U/L (ref 0–37)
Alkaline Phosphatase: 73 U/L (ref 39–117)
BUN: 16 mg/dL (ref 6–23)
CALCIUM: 9.3 mg/dL (ref 8.4–10.5)
CHLORIDE: 102 meq/L (ref 96–112)
CO2: 29 mEq/L (ref 19–32)
Creat: 0.8 mg/dL (ref 0.50–1.10)
Glucose, Bld: 95 mg/dL (ref 70–99)
POTASSIUM: 4.3 meq/L (ref 3.5–5.3)
Sodium: 138 mEq/L (ref 135–145)
Total Bilirubin: 0.3 mg/dL (ref 0.2–1.2)
Total Protein: 6.9 g/dL (ref 6.0–8.3)

## 2014-05-03 LAB — RHEUMATOID FACTOR: Rhuematoid fact SerPl-aCnc: <10 IU/mL (ref <=14)

## 2015-05-22 ENCOUNTER — Ambulatory Visit: Payer: 59 | Admitting: Podiatry

## 2015-05-23 ENCOUNTER — Ambulatory Visit (INDEPENDENT_AMBULATORY_CARE_PROVIDER_SITE_OTHER): Payer: 59

## 2015-05-23 ENCOUNTER — Encounter: Payer: Self-pay | Admitting: Podiatry

## 2015-05-23 ENCOUNTER — Ambulatory Visit (INDEPENDENT_AMBULATORY_CARE_PROVIDER_SITE_OTHER): Payer: 59 | Admitting: Podiatry

## 2015-05-23 VITALS — BP 139/82 | HR 70 | Resp 16 | Ht 62.0 in | Wt 150.0 lb

## 2015-05-23 DIAGNOSIS — M722 Plantar fascial fibromatosis: Secondary | ICD-10-CM | POA: Diagnosis not present

## 2015-05-23 DIAGNOSIS — M779 Enthesopathy, unspecified: Secondary | ICD-10-CM

## 2015-05-23 MED ORDER — TRIAMCINOLONE ACETONIDE 10 MG/ML IJ SUSP
10.0000 mg | Freq: Once | INTRAMUSCULAR | Status: AC
  Administered 2015-05-23: 10 mg

## 2015-05-23 NOTE — Progress Notes (Signed)
   Subjective:    Patient ID: Kathryn Lester, female    DOB: Dec 04, 1960, 54 y.o.   MRN: [id number]  HPI Patient presents with bilateral foot pain, top of foot; arch; some heel pain and back of heel; since 2008; pt stated, "more pain in the right foot"  Review of Systems  All other systems reviewed and are negative.      Objective:   Physical Exam        Assessment & Plan:

## 2015-05-23 NOTE — Progress Notes (Signed)
Subjective:     Patient ID: Barba Solt, female   DOB: 01-29-1961, 54 y.o.   MRN: [id number]  HPI patient presents with interpreter stating she's had pain in her right foot for a number of years which has worsened recently and moderate pain in her left foot also states that it has become increasingly hard to walk and she states that work can be difficult the time due to the amount of walking she needs to do   Review of Systems  All other systems reviewed and are negative.      Objective:   Physical Exam  Constitutional: She is oriented to person, place, and time.  Cardiovascular: Intact distal pulses.   Musculoskeletal: Normal range of motion.  Neurological: She is oriented to person, place, and time.  Skin: Skin is warm.  Nursing note and vitals reviewed.  neurovascular status was found to be intact with muscle strength adequate range of motion within normal limits. Patient is noted to have moderate depression of the arch and I did note quite a bit of discomfort in the extensor tendon complex and around the anterior tibial tendon and slightly distal. There is also pain in the plantar mid arch area right over left with mild to moderate discomfort in the left foot. Good digital perfusion is noted patient's well oriented 3     Assessment:     Tendinitis of the dorsal right foot along with plantar fascial symptomatology mid arch right    Plan:     H&P and x-rays of both feet reviewed. Today I did careful injection of the dorsal tissue 3 mg Kenalog 5 mill grams Xylocaine and went ahead and injected the plantar mid arch area right 3 mg Kenalog 5 mg Xylocaine and applied fascial brace. Due to the long-term nature of this problem I did scanned for custom orthotics today and we're getting to give her several days awful work to let the medicine work. Reappoint to recheck in 3 weeks and I reviewed this with her interpreter

## 2015-06-13 ENCOUNTER — Ambulatory Visit: Payer: 59 | Admitting: Podiatry

## 2015-06-20 ENCOUNTER — Ambulatory Visit (INDEPENDENT_AMBULATORY_CARE_PROVIDER_SITE_OTHER): Payer: 59 | Admitting: Podiatry

## 2015-06-20 DIAGNOSIS — M722 Plantar fascial fibromatosis: Secondary | ICD-10-CM | POA: Diagnosis not present

## 2015-06-20 DIAGNOSIS — M779 Enthesopathy, unspecified: Secondary | ICD-10-CM

## 2015-06-20 NOTE — Patient Instructions (Signed)
Fascitis plantar (Plantar Fasciitis) La fascitis plantar es una afeccin dolorosa que se produce en el taln. Ocurre cuando la banda de tejido que conecta los dedos con el hueso del taln (fascia plantar) se irrita. Esto puede ocurrir despus de hacer mucho ejercicio u otras actividades repetitivas (lesin por uso excesivo). El dolor de la fascitis plantar puede ser de leve (irritacin) a intenso, y en los casos ms agudos puede dificultar que la persona camine o se mueva. Por lo general, el dolor es peor a la maana o despus de permanecer sentado o acostado durante un perodo. CAUSAS Este trastorno puede ser causado por:  Estar de pie durante largos perodos.  Usar zapatos que no calcen bien.  Practicar actividades de alto impacto, como correr, o hacer ejercicios aerbicos o ballet.  Tener sobrepeso.  Tener una forma de caminar (marcha) anormal.  Tener los msculos de la pantorrilla tensos.  Tener el arco alto en los pies.  Comenzar una nueva actividad fsica. SNTOMAS El sntoma principal de esta afeccin es el dolor en el taln. Otros sntomas pueden ser los siguientes:  Dolor que empeora despus de una actividad o un ejercicio.  Dolor ms intenso a la maana o despus de descansar.  Dolor que desaparece despus de caminar durante unos minutos. DIAGNSTICO Esta afeccin se puede diagnosticar en funcin de los signos y los sntomas. El mdico tambin le realizar un examen fsico para controlar si tiene lo siguiente:  Un rea dolorida en la parte inferior del pie.  El arco alto.  Dolor al mover el pie.  Dificultad para mover el pie. Tambin puede necesitar estudios por imgenes para confirmar el diagnstico. Estos pueden incluir los siguientes:  Radiografas.  Ecografa.  Resonancia magntica. TRATAMIENTO  El tratamiento de la fascitis plantar depende de la gravedad de la afeccin. El tratamiento puede incluir lo siguiente:  Reposo, hielo y analgsicos de venta  libre para controlar el dolor.  Ejercicios para estirar las pantorrillas y la fascia plantar.  Una frula que mantiene el pie estirado y hacia arriba mientras usted duerme (frula nocturna).  Fisioterapia para aliviar los sntomas y evitar problemas en el futuro.  Inyecciones de cortisona para aliviar el dolor intenso.  Tratamiento con ondas de choque extracorpreas para estimular con impulsos elctricos la fascia plantar lesionada. Esto suele usarse como un ltimo recurso antes de la ciruga.  Ciruga, si los otros tratamientos no han funcionado despus de 12meses. INSTRUCCIONES PARA EL CUIDADO EN EL HOGAR  Tome los medicamentos solamente como se lo haya indicado el mdico.  Evite las actividades que causan dolor.  Frote la parte inferior del pie sobre una bolsa de hielo o una botella de agua fra. Haga esto durante 20minutos, de 3a 4veces al da.  Realice estiramientos simples como se lo haya indicado el mdico.  Trate de usar calzado deportivo con amortiguacin de aire o gel, o plantillas blandas.  Si el mdico se lo ha indicado, use una frula nocturna para dormir.  Cumpla con todas las visitas de control, segn le indique su mdico. PREVENCIN   No realice ejercicios ni actividades que le causen dolor en el taln.  Considere la posibilidad de empezar actividades de bajo impacto si sigue teniendo problemas.  Pierda peso si lo necesita. La mejor forma de prevenir la fascitis plantar es evitar las actividades que lesionan ms la fascia plantar. SOLICITE ATENCIN MDICA SI:  Los sntomas no desaparecen despus del tratamiento en su casa.  El dolor empeora.  El dolor afecta la capacidad de   moverse o de realizar las actividades diarias.   Esta informacin no tiene como fin reemplazar el consejo del mdico. Asegrese de hacerle al mdico cualquier pregunta que tenga.   Document Released: 05/21/2005 Document Revised: 12/26/2014 Elsevier Interactive Patient Education  2016 Elsevier Inc.  

## 2015-06-21 NOTE — Progress Notes (Signed)
Subjective:     Patient ID: Kathryn Lester, female   DOB: [birth year], 54 y.o.   MRN: [id number]  HPI patient presents stating I been having pain in my right heel but it's improved from previous and I'm here also to pickup orthotics   Review of Systems     Objective:   Physical Exam Neurovascular status intact muscle strength adequate moderate depression of the arch with inflammation of the plantar heel noted that is improved but present when palpated    Assessment:     Plantar fascial symptomatology present but improved from previous visit    Plan:     Spent a great deal time educating her on the importance of physical therapy stretching exercises and ice therapy and supportive shoe gear usage. Dispensed orthotics with all instructions on usage

## 2015-06-22 ENCOUNTER — Emergency Department (HOSPITAL_COMMUNITY): Payer: 59

## 2015-06-22 ENCOUNTER — Encounter (HOSPITAL_COMMUNITY): Payer: Self-pay | Admitting: Emergency Medicine

## 2015-06-22 ENCOUNTER — Emergency Department (HOSPITAL_COMMUNITY)
Admission: EM | Admit: 2015-06-22 | Discharge: 2015-06-22 | Disposition: A | Discharge status: Home / Self Care | Payer: 59 | Attending: Emergency Medicine | Admitting: Emergency Medicine

## 2015-06-22 DIAGNOSIS — R200 Anesthesia of skin: Secondary | ICD-10-CM | POA: Diagnosis not present

## 2015-06-22 DIAGNOSIS — R202 Paresthesia of skin: Secondary | ICD-10-CM | POA: Insufficient documentation

## 2015-06-22 DIAGNOSIS — R0602 Shortness of breath: Secondary | ICD-10-CM | POA: Insufficient documentation

## 2015-06-22 DIAGNOSIS — R002 Palpitations: Secondary | ICD-10-CM | POA: Insufficient documentation

## 2015-06-22 DIAGNOSIS — Z7951 Long term (current) use of inhaled steroids: Secondary | ICD-10-CM | POA: Diagnosis not present

## 2015-06-22 DIAGNOSIS — R55 Syncope and collapse: Secondary | ICD-10-CM | POA: Diagnosis not present

## 2015-06-22 DIAGNOSIS — Z791 Long term (current) use of non-steroidal anti-inflammatories (NSAID): Secondary | ICD-10-CM | POA: Insufficient documentation

## 2015-06-22 DIAGNOSIS — F419 Anxiety disorder, unspecified: Secondary | ICD-10-CM | POA: Diagnosis not present

## 2015-06-22 NOTE — ED Notes (Signed)
Patient d/c'd from monitor, continuous pulse oximetry and blood pressure cuff; patient getting dressed to be discharged home 

## 2015-06-22 NOTE — ED Notes (Signed)
Pt states was breathing fast, heart was racing, and hands felt numb, felt knees go out from under her, but "feet got really loose, did not pass out, felt weak, still conscious"

## 2015-06-22 NOTE — ED Notes (Signed)
Patient has returned from being out of the department; patient placed back on monitor, continuous pulse oximetry and blood pressure cuff; visitor at bedside 

## 2015-06-22 NOTE — Discharge Instructions (Signed)
Sncope  (Syncope)  Sncope significa que una persona se desvanece (se desmaya). La persona generalmente se despierta en menos de 5 minutos. Es importante buscar asistencia mdica en caso de sncope. CUIDADOS EN EL HOGAR   Pdale a alguien que se quede con usted [employer] se sienta normal.  No conduzca vehculos, no use maquinarias ni practique deportes hasta que el mdico lo autorice.  Cumpla con los controles mdicos segn las indicaciones.  Recustese cuando sienta que va a desmayarse. Respire profundamente. Espere a sentirse normal antes de ponerse de pie.  Beba gran cantidad de lquido para mantener el pis (orina) de tono claro o amarillo plido.  Si toma medicamentos para la presin arterial o para el corazn, levntese lentamente. Tmese algunos minutos para permanecer sentado y luego prese. SOLICITE AYUDA DE INMEDIATO SI:   Sufre un dolor intenso de [place].  Siente dolor intenso en el pecho, el vientre (abdomen) o la espalda.  Tiene un sangrado por la boca o el ano (recto).  La materia fecal (heces) es negra o de aspecto alquitranado.  Siente latidos irregulares o muy rpidos.  Siente dolor al respirar.  Se desvanece o sufre sacudidas (convulsiones) cuando se desvanece.  Se desmaya mientras se encuentra sentado o acostado.  Se siente confundido.  Presenta dificultad para caminar.  Siente debilidad intensa.  Tiene problemas de visin. Si se desvanece, llame para pedir ayuda (911 en los [place]). No conduzca por sus propios medios [employer].    Esta informacin no tiene Marine scientist el consejo del mdico. Asegrese de hacerle al mdico cualquier pregunta que tenga.   Document Released: 11/07/2008 Document Revised: 12/26/2014 Elsevier Interactive Patient Education 2016 [employer]. Burt y control del estrs (Stress and Stress Management) El estrs es una reaccin normal a los sucesos de la vida. Es lo que se siente cuando la vida le  exige ms de lo que est acostumbrado o ms de lo que puede [place]. Un poco de [employer] ser til. Por ejemplo, la reaccin al estrs puede ayudarlo a tomar el ltimo autobs del da, a estudiar para un examen o a cumplir con un plazo lmite en el trabajo. Sin embargo, el Retail buyer frecuente o que dura mucho tiempo puede causar problemas. Puede afectar la salud emocional y [place] y las actividades cotidianas normales. El exceso de estrs puede Academic librarian sistema inmunitario y aumentar el riesgo de que tenga enfermedades fsicas. Si ya tiene un problema mdico, el estrs puede empeorarlo. CAUSAS  [employer] sucesos de la vida pueden causar estrs. Un suceso que le causa estrs a una persona puede no ser estresante para Theatre manager. Generalmente, los sucesos importantes de la vida causan estrs. Estos pueden ser positivos o negativos, por ejemplo, quedarse sin empleo, mudarse a una casa nueva, casarse, tener un beb o perder a un ser querido. Los sucesos de la vida menos evidentes tambin pueden causar estrs, especialmente si ocurren da tras da o en combinacin. Por ejemplo, trabajar muchas horas, conducir cuando hay mucho trnsito, cuidar a los nios, tener deudas o tener una relacin difcil. SIGNOS Y SNTOMAS El estrs puede causar sntomas emocionales que incluyen lo siguiente:  Ansiedad. Sentirse preocupado, temeroso, nervioso, abrumado o fuera de control.  Enojo. Sentirse irritado o impaciente.  Depresin. Sentirse triste, decado, desesperanzado o culpable.  Dificultad para concentrarse, recordar o tomar decisiones. El estrs puede causar sntomas fsicos que incluyen lo siguiente:   Dolores y [place]. Estos pueden afectar la cabeza, el cuello, la  espalda, el estmago u otras zonas del cuerpo.  Rigidez muscular o tensin mandibular.  Poca energa o dificultad para dormir. El estrs puede provocar conductas poco saludables, que incluyen lo siguiente:   Comer para  sentirse mejor (comer en exceso) o IT consultant comidas.  Dormir [employer], mucho o ambas cosas.  Trabajar mucho o postergar tareas (posponer).  Fumar, beber alcohol o consumir drogas para sentirse mejor. DIAGNSTICO  El estrs se diagnostica a travs de una evaluacin que realiza el mdico. El mdico le har preguntas sobre los sntomas o cualquier suceso estresante de la vida. Adems, el mdico le har preguntas sobre sus antecedentes mdicos y tal vez indique anlisis de [place] u otros estudios. Ciertas afecciones y algunos medicamentos pueden provocar sntomas fsicos similares a los del estrs. Las enfermedades mentales pueden causar sntomas emocionales y Actor conductas poco sanas similares a los del estrs. El mdico podr derivarlo a un profesional de la salud mental para que le realice una evaluacin ms profunda.  TRATAMIENTO  El tratamiento recomendado para el estrs es el control del estrs. Los [employer] del control del estrs son reducir los eventos estresantes de la vida y enfrentar el estrs de maneras saludables.  Las tcnicas para reducir los eventos estresantes de la vida incluyen lo siguiente:  Identificacin del estrs. Autocontrolar el estrs e identificar qu lo causa. Estas habilidades pueden ayudarlo a evitar algunos sucesos estresantes.  Control del tiempo. Establecer las prioridades, llevar un calendario de los sucesos y aprender a Software engineer "no". Estas herramientas pueden ayudarlo a evitar que asuma muchos compromisos. Las tcnicas para lidiar con el estrs incluyen lo siguiente:  Replantearse el problema. Tratar de pensar de un modo realista los eventos estresantes en lugar de ignorarlos o Horticulturist, commercial. Intente encontrar los aspectos positivos en una situacin estresante, en lugar de enfocarse en los negativos.  La actividad fsica. El ejercicio fsico puede liberar la tensin fsica y Architectural technologist. La clave es encontrar un tipo de ejercicio fsico que disfrute  y practique con regularidad.  Tcnicas de relajacin. Estas relajan el cuerpo y la Enigma. Los ejemplos son el yoga, la meditacin, el tai chi, la biorregulacin, la respiracin profunda, la relajacin muscular progresiva, Conservation officer, nature, estar al [employer] en contacto con la naturaleza, llevar un diario y otros pasatiempos. Nuevamente, la clave es encontrar una o ms opciones que disfrute y Hydrographic surveyor con regularidad.  Estilo de vida saludable. Coma una dieta equilibrada, duerma mucho y no fume. Evite el consumo de alcohol o de drogas para relajarse.  Red de apoyo slida. Pase tiempo con la familia, los amigos y otras personas cuya compaa disfrute. Exprese sus sentimientos y converse acerca de las cosas que le preocupan con alguien en quien confe. La orientacin o la psicoterapiacon un profesional de la salud mental pueden ser de ayuda si tiene dificultades para controlar el estrs por s solo. Generalmente, no se recomiendan medicamentos para el tratamiento del estrs. Hable con el mdico si considera que necesita medicamentos para los sntomas de estrs. INSTRUCCIONES PARA EL CUIDADO EN EL HOGAR  Concurra a todas las visitas de control como se lo haya indicado el mdico.  Tome todos los medicamentos como se lo haya indicado el mdico. SOLICITE ATENCIN MDICA SI:  Los sntomas empeoran o empieza a tener sntomas nuevos.  Se siente abrumado por los problemas y ya no puede manejarlos solo. SOLICITE ATENCIN MDICA DE INMEDIATO SI:  Siente deseos de lastimarse o lastimar a Nurse, children's.   Esta informacin no tiene  como fin reemplazar el consejo del mdico. Asegrese de hacerle al mdico cualquier pregunta que tenga.   Document Released: 05/21/2005 Document Revised: 09/01/2014 Elsevier Interactive Patient Education [employer].

## 2015-06-22 NOTE — ED Notes (Signed)
Pt is an employee in [employer] upstairs, had a syncopal episode this am, crying, states is under a great deal of pressure with work. States feels fine, "just nervous" , has a IT consultantnew supervisor.

## 2015-06-22 NOTE — ED Provider Notes (Addendum)
CSN: [id number]     Arrival date & time 06/22/15  1144 History   First MD Initiated Contact with Patient 06/22/15 1200     Chief Complaint  Patient presents with  . Loss of Consciousness     (Consider location/radiation/quality/duration/timing/severity/associated sxs/prior Treatment) HPI Comments: Patient presents to the emergency department for evaluation of syncope. Patient reports that she was at work when she started having racing heartbeat, breathing became fast and she became numb and tingly in her hands and feet. She reports that her legs give out underneath her and she fell to the ground, no injury. She did not lose consciousness, was awake throughout. Patient reports that she has been under a tremendous amount of stress at work. She has never had episodes like this before. There is no chest pain associated with the symptoms.  Patient is a 54 y.o. female presenting with syncope.  Loss of Consciousness Associated symptoms: palpitations and shortness of breath     History reviewed. No pertinent past medical history. Past Surgical History  Procedure Laterality Date  . Cesarean section     No family history on file. Social History  Substance Use Topics  . Smoking status: Never Smoker   . Smokeless tobacco: None  . Alcohol Use: No   OB History    No data available     Review of Systems  Respiratory: Positive for shortness of breath.   Cardiovascular: Positive for palpitations and syncope.  Neurological: Positive for syncope.  Psychiatric/Behavioral: The patient is nervous/anxious.   All other systems reviewed and are negative.     Allergies  Review of patient's allergies indicates no known allergies.  Home Medications   Prior to Admission medications   Medication Sig Start Date End Date Taking? Authorizing Provider  fluticasone (FLONASE) 50 MCG/ACT nasal spray 1 spray each nostril at hs 05/02/14   Tonye Pearsonobert P Doolittle, MD  meloxicam (MOBIC) 15 MG tablet Take 1  tablet (15 mg total) by mouth daily. 05/02/14   Tonye Pearsonobert P Doolittle, MD  polyethylene glycol powder (GLYCOLAX/MIRALAX) powder Take 17 g by mouth 2 (two) times daily as needed. 11/02/13   Elvina SidleKurt Lauenstein, MD   BP 138/89 mmHg  Pulse 78  Temp(Src) 98.7 F (37.1 C) (Oral)  Resp 18  SpO2 100%  LMP 12/08/2011 Physical Exam  Constitutional: She is oriented to person, place, and time. She appears well-developed and well-nourished. No distress.  HENT:  Head: Normocephalic and atraumatic.  Right Ear: Hearing normal.  Left Ear: Hearing normal.  Nose: Nose normal.  Mouth/Throat: Oropharynx is clear and moist and mucous membranes are normal.  Eyes: Conjunctivae and EOM are normal. Pupils are equal, round, and reactive to light.  Neck: Normal range of motion. Neck supple.  Cardiovascular: Regular rhythm, S1 normal and S2 normal.  Exam reveals no gallop and no friction rub.   No murmur heard. Pulmonary/Chest: Effort normal and breath sounds normal. No respiratory distress. She exhibits no tenderness.  Abdominal: Soft. Normal appearance and bowel sounds are normal. There is no hepatosplenomegaly. There is no tenderness. There is no rebound, no guarding, no tenderness at McBurney's point and negative Murphy's sign. No hernia.  Musculoskeletal: Normal range of motion.  Neurological: She is alert and oriented to person, place, and time. She has normal strength. No cranial nerve deficit or sensory deficit. Coordination normal. GCS eye subscore is 4. GCS verbal subscore is 5. GCS motor subscore is 6.  Skin: Skin is warm, dry and intact. No rash noted. No cyanosis.  Psychiatric: She has a normal mood and affect. Her speech is normal and behavior is normal. Thought content normal.  Nursing note and vitals reviewed.   ED Course  Procedures (including critical care time) Labs Review Labs Reviewed  CBC WITH DIFFERENTIAL/PLATELET  BASIC METABOLIC PANEL  TROPONIN I    Imaging Review No results found. I  have personally reviewed and evaluated these images and lab results as part of my medical decision-making.   EKG Interpretation   Date/Time:  Friday June 22 2015 11:53:39 EDT Ventricular Rate:  81 PR Interval:  145 QRS Duration: 102 QT Interval:  382 QTC Calculation: 443 R Axis:   60 Text Interpretation:  Sinus rhythm Normal ECG Confirmed by POLLINA  MD,  CHRISTOPHER [phone]) on 06/22/2015 12:00:55 PM      MDM   Final diagnoses:  None   near syncope  Hyperventilation  Patient presents to the ER for evaluation of syncope. Patient reports that she is under a lot of stress and feels very anxious. She had onset of hyperventilation followed by numbness and tingling in her hands and feet and then nearly passed out. She is feeling better at arrival to the ER. Vital signs are normal. EKG unremarkable. Chest x-ray unremarkable. Patient declines lab work. Very low risk for cardiac etiology. Nothing in the history suggest PE.    Gilda Crease, MD 06/22/15 1227  Gilda Crease, MD 06/22/15 1331

## 2015-06-22 NOTE — ED Notes (Signed)
Pt refused blood draw

## 2015-06-22 NOTE — ED Notes (Signed)
Patient undressed, in gown, on monitor, continuous pulse oximetry and blood pressure cuff 

## 2016-01-01 DIAGNOSIS — L811 Chloasma: Secondary | ICD-10-CM | POA: Diagnosis not present

## 2017-03-17 ENCOUNTER — Ambulatory Visit (INDEPENDENT_AMBULATORY_CARE_PROVIDER_SITE_OTHER): Payer: 59 | Admitting: Family Medicine

## 2017-03-17 ENCOUNTER — Encounter: Payer: Self-pay | Admitting: Family Medicine

## 2017-03-17 VITALS — BP 137/82 | HR 86 | Temp 97.8°F | Resp 16 | Ht 62.0 in | Wt 142.0 lb

## 2017-03-17 DIAGNOSIS — F418 Other specified anxiety disorders: Secondary | ICD-10-CM

## 2017-03-17 DIAGNOSIS — R208 Other disturbances of skin sensation: Secondary | ICD-10-CM | POA: Diagnosis not present

## 2017-03-17 DIAGNOSIS — R42 Dizziness and giddiness: Secondary | ICD-10-CM | POA: Diagnosis not present

## 2017-03-17 DIAGNOSIS — G4700 Insomnia, unspecified: Secondary | ICD-10-CM

## 2017-03-17 DIAGNOSIS — R002 Palpitations: Secondary | ICD-10-CM | POA: Diagnosis not present

## 2017-03-17 MED ORDER — HYDROXYZINE HCL 25 MG PO TABS: 12.5000 mg | ORAL_TABLET | Freq: Three times a day (TID) | ORAL | 0 refills | Status: DC | PRN

## 2017-03-17 NOTE — Patient Instructions (Addendum)
Return in 2 weeks to discuss symptoms further and can review blood tests at that time. See information on dizziness, heart palpitations and stress below. Increase exercise to most days per week and try other relaxation techniques at bedtime. Can discuss other medicine if needed at follow up.   Return to the clinic or go to the nearest emergency room if any of your symptoms worsen or new symptoms occur.   Estrs y control del estrs (Stress and Stress Management) El estrs es una reaccin normal a los sucesos de la vida. Es lo que se siente cuando la vida le exige ms de lo que est acostumbrado o ms de lo que puede [place]. Un poco de [employer] ser til. Por ejemplo, la reaccin al estrs puede ayudarlo a tomar el ltimo autobs del da, a estudiar para un examen o a cumplir con un plazo lmite en el trabajo. Sin embargo, el Retail buyer frecuente o que dura mucho tiempo puede causar problemas. Puede afectar la salud emocional y [place] y las actividades cotidianas normales. El exceso de estrs puede Academic librarian sistema inmunitario y aumentar el riesgo de que tenga enfermedades fsicas. Si ya tiene un problema mdico, el estrs puede empeorarlo. CAUSAS [employer] sucesos de la vida pueden causar estrs. Un suceso que le causa estrs a una persona puede no ser estresante para Theatre manager. Generalmente, los sucesos importantes de la vida causan estrs. Estos pueden ser positivos o negativos, por ejemplo, quedarse sin empleo, mudarse a una casa nueva, casarse, tener un beb o perder a un ser querido. Los sucesos de la vida menos evidentes tambin pueden causar estrs, especialmente si ocurren da tras da o en combinacin. Por ejemplo, trabajar muchas horas, conducir cuando hay mucho trnsito, cuidar a los nios, tener deudas o tener una relacin difcil. SIGNOS Y SNTOMAS El estrs puede causar sntomas emocionales que incluyen lo siguiente:  Ansiedad. Sentirse preocupado, temeroso, nervioso,  abrumado o fuera de control.  Enojo. Sentirse irritado o impaciente.  Depresin. Sentirse triste, decado, desesperanzado o culpable.  Dificultad para concentrarse, recordar o tomar decisiones. El estrs puede causar sntomas fsicos que incluyen lo siguiente:  Dolores y [place]. Estos pueden afectar la cabeza, el cuello, la espalda, el estmago u otras zonas del cuerpo.  Rigidez muscular o tensin mandibular.  Poca energa o dificultad para dormir. El estrs puede provocar conductas poco saludables, que incluyen lo siguiente:  Comer para sentirse mejor (comer en exceso) o IT consultant comidas.  Dormir [employer], mucho o ambas cosas.  Trabajar mucho o postergar tareas (posponer).  Fumar, beber alcohol o consumir drogas para sentirse mejor. DIAGNSTICO El estrs se diagnostica a travs de una evaluacin que realiza el mdico. El mdico le har preguntas sobre los sntomas o cualquier suceso estresante de la vida. Adems, el mdico le har preguntas sobre sus antecedentes mdicos y tal vez indique anlisis de [place] u otros estudios. Ciertas afecciones y algunos medicamentos pueden provocar sntomas fsicos similares a los del estrs. Las enfermedades mentales pueden causar sntomas emocionales y Actor conductas poco sanas similares a los del estrs. El mdico podr derivarlo a un profesional de la salud mental para que le realice una evaluacin ms profunda. TRATAMIENTO El tratamiento recomendado para el estrs es el control del estrs. Los [employer] del control del estrs son reducir los eventos estresantes de la vida y enfrentar el estrs de maneras saludables. Las tcnicas para reducir los eventos estresantes de la vida incluyen lo siguiente:  Identificacin del estrs. Autocontrolar el estrs  e identificar qu lo causa. Estas habilidades pueden ayudarlo a evitar algunos sucesos estresantes.  Control del tiempo. Establecer las prioridades, llevar un calendario de los sucesos y  aprender a Software engineer "no". Estas herramientas pueden ayudarlo a evitar que asuma muchos compromisos. Las tcnicas para lidiar con el estrs incluyen lo siguiente:  Replantearse el problema. Tratar de pensar de un modo realista los eventos estresantes en lugar de ignorarlos o Horticulturist, commercial. Intente encontrar los aspectos positivos en una situacin estresante, en lugar de enfocarse en los negativos.  La actividad fsica. El ejercicio fsico puede liberar la tensin fsica y Architectural technologist. La clave es encontrar un tipo de ejercicio fsico que disfrute y practique con regularidad.  Tcnicas de relajacin. Estas relajan el cuerpo y la [place]. Los ejemplos son el yoga, la meditacin, el tai chi, la biorregulacin, la respiracin profunda, la relajacin muscular progresiva, Conservation officer, nature, estar al [employer] en contacto con la naturaleza, llevar un diario y otros pasatiempos. Nuevamente, la clave es encontrar una o ms opciones que disfrute y Hydrographic surveyor con regularidad.  Estilo de vida saludable. Coma una dieta equilibrada, duerma mucho y no fume. Evite el consumo de alcohol o de drogas para relajarse.  Red de apoyo slida. Pase tiempo con la familia, los amigos y otras personas cuya compaa disfrute. Exprese sus sentimientos y converse acerca de las cosas que le preocupan con alguien en quien confe. La orientacin o la psicoterapiacon un profesional de la salud mental pueden ser de ayuda si tiene dificultades para controlar el estrs por s solo. Generalmente, no se recomiendan medicamentos para el tratamiento del estrs. Hable con el mdico si considera que necesita medicamentos para los sntomas de estrs. INSTRUCCIONES PARA EL CUIDADO EN EL HOGAR  Concurra a todas las visitas de control como se lo haya indicado el mdico.  Tome todos los medicamentos como se lo haya indicado el mdico.  SOLICITE ATENCIN MDICA SI:  Los sntomas empeoran o empieza a tener sntomas nuevos.  Se siente  abrumado por los problemas y ya no puede manejarlos solo.  SOLICITE ATENCIN MDICA DE INMEDIATO SI:  Siente deseos de lastimarse o lastimar a Nurse, children's.  Esta informacin no tiene Marine scientist el consejo del mdico. Asegrese de hacerle al mdico cualquier pregunta que tenga. Document Released: 05/21/2005 Document Revised: 12/03/2015 Document Reviewed: 04/05/2013 Elsevier Interactive Patient Education  2017 [place] (Dizziness) Los mareos son un problema muy frecuente. Se trata de una sensacin de inestabilidad o de desvanecimiento. Puede sentir que se va a desmayar. Un mareo puede provocarle una lesin si se tropieza o se cae. Las Engineer, manufacturing de todas las edades pueden sufrir Tree surgeon, Armed forces training and education officer es ms frecuente en los adultos [place]. Esta afeccin puede tener muchas causas, entre las que se pueden [employer], la deshidratacin y [place]. INSTRUCCIONES PARA EL CUIDADO EN EL HOGAR Estas indicaciones pueden ayudarlo con el trastorno: Comida y bebida  Beba suficiente lquido para Theatre manager la orina clara o de color amarillo plido. Esto evita la deshidratacin. Trate de beber ms lquidos transparentes, como agua.  No beba alcohol.  Limite el consumo de cafena si el mdico se lo indica.  Limite el consumo de sal si el mdico se lo indica. Actividad  Evite los movimientos rpidos. ? Levntese de las sillas con lentitud y apyese hasta sentirse bien. ? Por la maana, sintese primero a un lado de la cama. Cuando se sienta bien, pngase lentamente de pie mientras se sostiene de algo, hasta que  sepa que ha logrado el equilibrio.  Mueva las piernas con frecuencia si debe estar de pie en un lugar durante mucho tiempo. Mientras est de pie, contraiga y relaje los msculos de las piernas.  No conduzca vehculos ni opere maquinaria pesada si se siente mareado.  Evite agacharse si se siente mareado. En su casa, coloque los objetos de modo que le resulte  fcil alcanzarlos sin Office manager. Estilo de vida  No consuma ningn producto que contenga tabaco, lo que incluye cigarrillos, tabaco de Higher education careers adviser o Psychologist, sport and exercise. Si necesita ayuda para dejar de fumar, consulte al [employer].  Trate de reducir el nivel de estrs practicando actividades como el yoga o la meditacin. Hable con el mdico si necesita ayuda. Instrucciones generales  Controle sus mareos para ver si hay cambios.  Tome los medicamentos solamente como se lo haya indicado el mdico. Hable con el mdico si cree que los medicamentos que est tomando son la causa de sus mareos.  Infrmele a un amigo o a un familiar si se siente mareado. Pdale a esta persona que llame al mdico si observa cambios en su comportamiento.  Concurra a todas las visitas de control como se lo haya indicado el mdico. Esto es importante. SOLICITE ATENCIN MDICA SI:  Los [employer].  Los [employer] o la sensacin de Engineer, petroleum.  Siente nuseas.  Ha perdido la audicin.  Aparecen nuevos sntomas.  Cuando est de pie se siente inestable o que la habitacin da vueltas.  SOLICITE ATENCIN MDICA DE INMEDIATO SI:  Vomita o tiene diarrea y no puede comer ni beber nada.  Tiene dificultad para hablar, para caminar, para tragar o para [employer], las [place] piernas.  Siente una debilidad generalizada.  No piensa con claridad o tiene dificultades para armar oraciones. Es posible que un amigo o un familiar adviertan que esto ocurre.  Tiene dolor de pecho, dolor abdominal, sudoracin o Risk manager.  Hay cambios en la visin.  Observa un sangrado.  Tiene dolores de [place].  Tiene dolor o rigidez en el cuello.  Tiene fiebre.  Esta informacin no tiene Marine scientist el consejo del mdico. Asegrese de hacerle al mdico cualquier pregunta que tenga. Document Released: 08/11/2005 Document Revised: 12/26/2014 Document Reviewed: 08/07/2014 Elsevier Interactive  Patient Education  2017 [place] (Palpitations) Es la sensacin de sentir que el latido cardaco es irregular o es ms rpido que lo normal. Puede sentir como un aleteo o que falta un latido. Generalmente no es un problema grave. Assumption palpitaciones pueden ser diversas, entre ellas, el estrs y consumo de cigarrillos, cafena, alcohol y determinados medicamentos. Si bien la [employer] de las causas de las palpitaciones no son graves, estas pueden ser un signo de un problema mdico grave. En algunos casos, podra ser necesario hacer ms estudios. INSTRUCCIONES PARA EL CUIDADO EN EL HOGAR Est atento a cualquier cambio en los sntomas. Tome estas medidas para controlar la afeccin:  Evite consumir lo siguiente: ? TEFL teacher que contengan cafena como el caf, el t, los refrescos, las pastillas para Horticulturist, commercial y las bebidas energizantes. ? Chocolate. ? Alcohol.  No consuma ningn producto que contenga tabaco, lo que incluye cigarrillos, tabaco de Higher education careers adviser y Psychologist, sport and exercise. Si necesita ayuda para dejar de fumar, consulte al [employer].  Trate de reducir los niveles de estrs y [place]. Las siguientes actividades pueden ayudarlo a relajarse: ? Practicar yoga. ? Meditacin. ? Actividad fsica como natacin, trote o caminatas. ? Biorretroalimentacin. Este es  un mtodo que le ensea a usar la mente para Chief Technology Officer cosas del cuerpo, como los latidos del corazn.  Descanse y duerma lo suficiente.  Tome los medicamentos de venta libre y los recetados solamente como se lo haya indicado el mdico.  Consulting civil engineer a todas las visitas de control como se lo haya indicado el mdico. Esto es importante. SOLICITE ATENCIN MDICA SI:  Contina con latidos cardacos rpidos o irregulares despus de 24 horas.  Las [employer] Materials suceden con ms frecuencia.  SOLICITE ATENCIN MDICA DE INMEDIATO SI:  Siente falta de aire o dolor en el pecho.  Tiene un dolor de cabeza  intenso.  Se siente mareado o se desmaya.  Esta informacin no tiene Marine scientist el consejo del mdico. Asegrese de hacerle al mdico cualquier pregunta que tenga. Document Released: 05/21/2005 Document Revised: 12/03/2015 Document Reviewed: 04/26/2015 Elsevier Interactive Patient Education  2017 [employer].    IF you received an x-ray today, you will receive an invoice from [facility]. Please contact [facility] at [phone] with questions or concerns regarding your invoice.   IF you received labwork today, you will receive an invoice from [facility]. Please contact [facility] at [phone] with questions or concerns regarding your invoice.   Our billing staff will not be able to assist you with questions regarding bills from these companies.  You will be contacted with the lab results as soon as they are available. The fastest way to get your results is to activate your My Chart account. Instructions are located on the last page of this paperwork. If you have not heard from [place] regarding the results in 2 weeks, please contact this office.

## 2017-03-17 NOTE — Progress Notes (Signed)
Subjective:  By signing my name below, I, Kathryn Lester, attest that this documentation has been prepared under the direction and in the presence of Kathryn Agreste, MD Electronically Signed: Ladene Artist, ED Scribe 03/17/2017 at 11:41 AM.   Patient ID: Kathryn Lester, female    DOB: 16-Jul-1961, 56 y.o.   MRN: [id number]  Chief Complaint  Patient presents with  . Anxiety    feeling panicked, unable to breath when it occurs  . Leg Pressure    bilateral foot burning and pressure   HPI Kathryn Lester is a 56 y.o. female who presents to [place] [facility]. New pt to me. Previously seen by Dr. Laney Pastor a few years ago. Pt speaks English but prefers Spanish For some of the paperwork. Expressed understanding of English throughout visit, with Spanish spoken at times.  Anxiety Symptoms Pt states she has been having episodic anxiety when under a lot of stress for a while. She has never taking medication for anxiety or seen a therapist or counselor. Pt reports experiencing heart palpations around 2 AM this morning while she was asleep. States she experiences palpitations twice/year with minimal dizziness approximately three times/year. Denies palpitations with walking or running. Feels better with exercise She denies being under increased stress at the moment since she switched to evening shifts (5-6 hours) at at [employer] for [employer]. She denies chest pain, chest tightness, light-headedness, difficulty breathing, sob. Currently walks 2 days/week.  At end of visit she did request something to help her sleep at night is having some trouble getting to sleep. Does not want a very strong medicine.  Foot Burning/Discomfort  Pt reports heaviness/pressure in her lower legs that radiates into her toes last night while going to bed. States she initially noticed discomfort a few years ago but burning sensation has gradually worsened over the past month. States her veins feel like  they are sleepy at times. She is currently experiencing pain at this time. Pt reports improvement with stretching, walking and running. States she last had her cholesterol checked 2 years ago.   Patient Active Problem List   Diagnosis Date Noted  . KNEE PAIN, RIGHT 07/04/2008  . SPRAIN AND STRAIN OF SACROILIAC 07/04/2008   History reviewed. No pertinent past medical history. Past Surgical History:  Procedure Laterality Date  . CESAREAN SECTION     No Known Allergies Prior to Admission medications   Medication Sig Start Date End Date Taking? Authorizing Provider  fluticasone (FLONASE) 50 MCG/ACT nasal spray 1 spray each nostril at hs Patient not taking: Reported on 03/17/2017 05/02/14   [name], MD  meloxicam (MOBIC) 15 MG tablet Take 1 tablet (15 mg total) by mouth daily. Patient not taking: Reported on 03/17/2017 05/02/14   [name], MD  polyethylene glycol powder (GLYCOLAX/MIRALAX) powder Take 17 g by mouth 2 (two) times daily as needed. Patient not taking: Reported on 03/17/2017 11/02/13   [name], MD   Social History   Social History  . Marital status: Single    Spouse name: N/A  . Number of children: N/A  . Years of education: N/A   Occupational History  . Not on file.   Social History Main Topics  . Smoking status: Never Smoker  . Smokeless tobacco: Never Used  . Alcohol use No  . Drug use: No  . Sexual activity: Not on file   Other Topics Concern  . Not on file   Social History Narrative  . No narrative  on file   Review of Systems  Respiratory: Negative for chest tightness and shortness of breath.   Cardiovascular: Positive for palpitations. Negative for chest pain.  Neurological: Positive for dizziness (occasional). Negative for light-headedness.  Psychiatric/Behavioral: The patient is nervous/anxious.       Objective:   Physical Exam  Constitutional: She is oriented to person, place, and time. She appears well-developed and  well-nourished.  HENT:  Head: Normocephalic and atraumatic.  Eyes: Pupils are equal, round, and reactive to light. Conjunctivae and EOM are normal. Right eye exhibits no nystagmus. Left eye exhibits no nystagmus.  Neck: Carotid bruit is not present.  Cardiovascular: Normal rate, regular rhythm, normal heart sounds and intact distal pulses.   Pulses:      Radial pulses are 2+ on the right side, and 2+ on the left side.  DP pulse palpable bilateral.  Pulmonary/Chest: Effort normal and breath sounds normal.  Abdominal: Soft. She exhibits no pulsatile midline mass. There is no tenderness.  Musculoskeletal:  Toes are warm. Cap refill less than 1 second in toes. No LE edema. Slight discomfort. Sensation intact to distal toes. Cap refill less than 2 seconds at fingertips.   Neurological: She is alert and oriented to person, place, and time. She displays a negative Romberg sign.  No pronator drift. Non-focal neuro exam.  Skin: Skin is warm and dry.  Psychiatric: She has a normal mood and affect. Her behavior is normal. Thought content normal.  Vitals reviewed.  Vitals:   03/17/17 1054 03/17/17 1115  BP: (!) 157/80 137/82  Pulse: 86   Resp: 16   Temp: 97.8 F (36.6 C)   TempSrc: Oral   SpO2: 99%   Weight: 142 lb (64.4 kg)   Height: '5\' 2"'  (1.575 m)    EKG sinus rhythm, slightly low voltage in precordial leads, no acute findings.    Assessment & Plan:   [name] is a 56 y.o. female Dysesthesia - Plan: EKG 69-SWNI, Basic metabolic panel, TSH  Heart palpitations - Plan: EKG 62-VOJJ, Basic metabolic panel, TSH, CBC  Episode of dizziness - Plan: EKG 00-XFGH, Basic metabolic panel, TSH, CBC  Situational anxiety  Insomnia, unspecified type  Possible situational anxiety, with dizziness, palpitations,  dysesthesias. Nonfocal neuro exam. Will check TSH, BMP, CBC to rule out anemia, screen for thyroid disease, but no concerning findings on EKG.  - Trial of hydroxyzine 12.5-25 mg 3  times a day when necessary. Side effects discussed.  - Handout given on dizziness, palpitations, as well as stress.  -  -Encouraged increased exercise to help with stress management, relaxation techniques at bedtime, follow-up in 2 weeks to discuss results and to discuss other options if needed.   No orders of the defined types were placed in this encounter.  Patient Instructions   Return in 2 weeks to discuss symptoms further and can review blood tests at that time. See information on dizziness, heart palpitations and stress below. Increase exercise to most days per week and try other relaxation techniques at bedtime. Can discuss other medicine if needed at follow up.   Return to the clinic or go to the nearest emergency room if any of your symptoms worsen or new symptoms occur.   Estrs y control del estrs (Stress and Stress Management) El estrs es una reaccin normal a los sucesos de la vida. Es lo que se siente cuando la vida le exige ms de lo que est acostumbrado o ms de lo que puede [place]. Un poco de estrs  puede ser til. Por ejemplo, la reaccin al estrs puede ayudarlo a tomar el ltimo autobs del da, a estudiar para un examen o a cumplir con un plazo lmite en el trabajo. Sin embargo, el Retail buyer frecuente o que dura mucho tiempo puede causar problemas. Puede afectar la salud emocional y [place] y las actividades cotidianas normales. El exceso de estrs puede Academic librarian sistema inmunitario y aumentar el riesgo de que tenga enfermedades fsicas. Si ya tiene un problema mdico, el estrs puede empeorarlo. CAUSAS The Mutual [employer] Omaha sucesos de la vida pueden causar estrs. Un suceso que le causa estrs a una persona puede no ser estresante para Theatre manager. Generalmente, los sucesos importantes de la vida causan estrs. Estos pueden ser positivos o negativos, por ejemplo, quedarse sin empleo, mudarse a una casa nueva, casarse, tener un beb o perder a un ser querido. Los sucesos  de la vida menos evidentes tambin pueden causar estrs, especialmente si ocurren da tras da o en combinacin. Por ejemplo, trabajar muchas horas, conducir cuando hay mucho trnsito, cuidar a los nios, tener deudas o tener una relacin difcil. SIGNOS Y SNTOMAS El estrs puede causar sntomas emocionales que incluyen lo siguiente:  Ansiedad. Sentirse preocupado, temeroso, nervioso, abrumado o fuera de control.  Enojo. Sentirse irritado o impaciente.  Depresin. Sentirse triste, decado, desesperanzado o culpable.  Dificultad para concentrarse, recordar o tomar decisiones. El estrs puede causar sntomas fsicos que incluyen lo siguiente:  Dolores y [place]. Estos pueden afectar la cabeza, el cuello, la espalda, el estmago u otras zonas del cuerpo.  Rigidez muscular o tensin mandibular.  Poca energa o dificultad para dormir. El estrs puede provocar conductas poco saludables, que incluyen lo siguiente:  Comer para sentirse mejor (comer en exceso) o IT consultant comidas.  Dormir Dow Chemical, mucho o ambas cosas.  Trabajar mucho o postergar tareas (posponer).  Fumar, beber alcohol o consumir drogas para sentirse mejor. DIAGNSTICO El estrs se diagnostica a travs de una evaluacin que realiza el mdico. El mdico le har preguntas sobre los sntomas o cualquier suceso estresante de la vida. Adems, el mdico le har preguntas sobre sus antecedentes mdicos y tal vez indique anlisis de [place] u otros estudios. Ciertas afecciones y algunos medicamentos pueden provocar sntomas fsicos similares a los del estrs. Las enfermedades mentales pueden causar sntomas emocionales y Actor conductas poco sanas similares a los del estrs. El mdico podr derivarlo a un profesional de la salud mental para que le realice una evaluacin ms profunda. TRATAMIENTO El tratamiento recomendado para el estrs es el control del estrs. Los [employer] H[employer]away del control del estrs son reducir los eventos  estresantes de la vida y enfrentar el estrs de maneras saludables. Las tcnicas para reducir los eventos estresantes de la vida incluyen lo siguiente:  Identificacin del estrs. Autocontrolar el estrs e identificar qu lo causa. Estas habilidades pueden ayudarlo a evitar algunos sucesos estresantes.  Control del tiempo. Establecer las prioridades, llevar un calendario de los sucesos y aprender a Software engineer "no". Estas herramientas pueden ayudarlo a evitar que asuma muchos compromisos. Las tcnicas para lidiar con el estrs incluyen lo siguiente:  Replantearse el problema. Tratar de pensar de un modo realista los eventos estresantes en lugar de ignorarlos o Horticulturist, commercial. Intente encontrar los aspectos positivos en una situacin estresante, en lugar de enfocarse en los negativos.  La actividad fsica. El ejercicio fsico puede liberar la tensin fsica y Architectural technologist. La clave es encontrar un tipo de ejercicio fsico que disfrute y practique con regularidad.  Tcnicas de relajacin. Estas relajan el cuerpo y la [place]. Los ejemplos son el yoga, la meditacin, el tai chi, la biorregulacin, la respiracin profunda, la relajacin muscular progresiva, Conservation officer, nature, estar al Auto-Owners Insurance en contacto con la naturaleza, llevar un diario y otros pasatiempos. Nuevamente, la clave es encontrar una o ms opciones que disfrute y Hydrographic surveyor con regularidad.  Estilo de vida saludable. Coma una dieta equilibrada, duerma mucho y no fume. Evite el consumo de alcohol o de drogas para relajarse.  Red de apoyo slida. Pase tiempo con la familia, los amigos y otras personas cuya compaa disfrute. Exprese sus sentimientos y converse acerca de las cosas que le preocupan con alguien en quien confe. La orientacin o la psicoterapiacon un profesional de la salud mental pueden ser de ayuda si tiene dificultades para controlar el estrs por s solo. Generalmente, no se recomiendan medicamentos para el  tratamiento del estrs. Hable con el mdico si considera que necesita medicamentos para los sntomas de estrs. INSTRUCCIONES PARA EL CUIDADO EN EL HOGAR  Concurra a todas las visitas de control como se lo haya indicado el mdico.  Tome todos los medicamentos como se lo haya indicado el mdico.  SOLICITE ATENCIN MDICA SI:  Los sntomas empeoran o empieza a tener sntomas nuevos.  Se siente abrumado por los problemas y ya no puede manejarlos solo.  SOLICITE ATENCIN MDICA DE INMEDIATO SI:  Siente deseos de lastimarse o lastimar a Nurse, children's.  Esta informacin no tiene Marine scientist el consejo del mdico. Asegrese de hacerle al mdico cualquier pregunta que tenga. Document Released: 05/21/2005 Document Revised: 12/03/2015 Document Reviewed: 04/05/2013 Elsevier Interactive Patient Education  2017 [place] (Dizziness) Los mareos son un problema muy frecuente. Se trata de una sensacin de inestabilidad o de desvanecimiento. Puede sentir que se va a desmayar. Un mareo puede provocarle una lesin si se tropieza o se cae. Las Engineer, manufacturing de todas las edades pueden sufrir Tree surgeon, Armed forces training and education officer es ms frecuente en los adultos [place]. Esta afeccin puede tener muchas causas, entre las que se pueden Kimberly-Clark, la deshidratacin y [place]. INSTRUCCIONES PARA EL CUIDADO EN EL HOGAR Estas indicaciones pueden ayudarlo con el trastorno: Comida y bebida  Beba suficiente lquido para Theatre manager la orina clara o de color amarillo plido. Esto evita la deshidratacin. Trate de beber ms lquidos transparentes, como agua.  No beba alcohol.  Limite el consumo de cafena si el mdico se lo indica.  Limite el consumo de sal si el mdico se lo indica. Actividad  Evite los movimientos rpidos. ? Levntese de las sillas con lentitud y apyese hasta sentirse bien. ? Por la maana, sintese primero a un lado de la cama. Cuando se sienta bien, pngase lentamente de pie  mientras se sostiene de algo, hasta que sepa que ha logrado el equilibrio.  Mueva las piernas con frecuencia si debe estar de pie en un lugar durante mucho tiempo. Mientras est de pie, contraiga y relaje los msculos de las piernas.  No conduzca vehculos ni opere maquinaria pesada si se siente mareado.  Evite agacharse si se siente mareado. En su casa, coloque los objetos de modo que le resulte fcil alcanzarlos sin Office manager. Estilo de vida  No consuma ningn producto que contenga tabaco, lo que incluye cigarrillos, tabaco de Higher education careers adviser o Psychologist, sport and exercise. Si necesita ayuda para dejar de fumar, consulte al [employer].  Trate de reducir el nivel de estrs practicando actividades como el yoga o la meditacin. Hable con el mdico  si necesita ayuda. Instrucciones generales  Controle sus mareos para ver si hay cambios.  Tome los medicamentos solamente como se lo haya indicado el mdico. Hable con el mdico si cree que los medicamentos que est tomando son la causa de sus mareos.  Infrmele a un amigo o a un familiar si se siente mareado. Pdale a esta persona que llame al mdico si observa cambios en su comportamiento.  Concurra a todas las visitas de control como se lo haya indicado el mdico. Esto es importante. SOLICITE ATENCIN MDICA SI:  Los [employer].  Los Terex Corporation o la sensacin de Engineer, petroleum.  Siente nuseas.  Ha perdido la audicin.  Aparecen nuevos sntomas.  Cuando est de pie se siente inestable o que la habitacin da vueltas.  SOLICITE ATENCIN MDICA DE INMEDIATO SI:  Vomita o tiene diarrea y no puede comer ni beber nada.  Tiene dificultad para hablar, para caminar, para tragar o para Aflac Incorporated, las [place] piernas.  Siente una debilidad generalizada.  No piensa con claridad o tiene dificultades para armar oraciones. Es posible que un amigo o un familiar adviertan que esto ocurre.  Tiene dolor de pecho, dolor abdominal, sudoracin  o Risk manager.  Hay cambios en la visin.  Observa un sangrado.  Tiene dolores de [place].  Tiene dolor o rigidez en el cuello.  Tiene fiebre.  Esta informacin no tiene Marine scientist el consejo del mdico. Asegrese de hacerle al mdico cualquier pregunta que tenga. Document Released: 08/11/2005 Document Revised: 12/26/2014 Document Reviewed: 08/07/2014 Elsevier Interactive Patient Education  2017 [place] (Palpitations) Es la sensacin de sentir que el latido cardaco es irregular o es ms rpido que lo normal. Puede sentir como un aleteo o que falta un latido. Generalmente no es un problema grave. [place] palpitaciones pueden ser diversas, entre ellas, el estrs y consumo de cigarrillos, cafena, alcohol y determinados medicamentos. Si bien la State Farm de las causas de las palpitaciones no son graves, estas pueden ser un signo de un problema mdico grave. En algunos casos, podra ser necesario hacer ms estudios. INSTRUCCIONES PARA EL CUIDADO EN EL HOGAR Est atento a cualquier cambio en los sntomas. Tome estas medidas para controlar la afeccin:  Evite consumir lo siguiente: ? TEFL teacher que contengan cafena como el caf, el t, los refrescos, las pastillas para Horticulturist, commercial y las bebidas energizantes. ? Chocolate. ? Alcohol.  No consuma ningn producto que contenga tabaco, lo que incluye cigarrillos, tabaco de Higher education careers adviser y Psychologist, sport and exercise. Si necesita ayuda para dejar de fumar, consulte al [employer].  Trate de reducir los niveles de estrs y [place]. Las siguientes actividades pueden ayudarlo a relajarse: ? Practicar yoga. ? Meditacin. ? Actividad fsica como natacin, trote o caminatas. ? Biorretroalimentacin. Este es un mtodo que le ensea a usar la mente para Chief Technology Officer cosas del cuerpo, como los latidos del corazn.  Descanse y duerma lo suficiente.  Tome los medicamentos de venta libre y los recetados solamente como se lo haya  indicado el mdico.  Consulting civil engineer a todas las visitas de control como se lo haya indicado el mdico. Esto es importante. SOLICITE ATENCIN MDICA SI:  Contina con latidos cardacos rpidos o irregulares despus de 24 horas.  Las Applied Materials suceden con ms frecuencia.  SOLICITE ATENCIN MDICA DE INMEDIATO SI:  Siente falta de aire o dolor en el pecho.  Tiene un dolor de cabeza intenso.  Se siente mareado o se desmaya.  Esta informacin no  tiene Marine scientist el consejo del mdico. Asegrese de hacerle al mdico cualquier pregunta que tenga. Document Released: 05/21/2005 Document Revised: 12/03/2015 Document Reviewed: 04/26/2015 Elsevier Interactive Patient Education  2017 [employer].    IF you received an x-ray today, you will receive an invoice from [facility]. Please contact [facility] at [phone] with questions or concerns regarding your invoice.   IF you received labwork today, you will receive an invoice from [facility]. Please contact [facility] at [phone] with questions or concerns regarding your invoice.   Our billing staff will not be able to assist you with questions regarding bills from these companies.  You will be contacted with the lab results as soon as they are available. The fastest way to get your results is to activate your My Chart account. Instructions are located on the last page of this paperwork. If you have not heard from [place] regarding the results in 2 weeks, please contact this office.       I personally performed the services described in this documentation, which was scribed in my presence. The recorded information has been reviewed and considered for accuracy and completeness, addended by me as needed, and agree with information above.  Signed,   Merri Ray, MD [facility] [place].  03/17/17 12:08 PM

## 2017-03-18 LAB — BASIC METABOLIC PANEL
BUN/Creatinine Ratio: 15 (ref 9–23)
BUN: 11 mg/dL (ref 6–24)
CHLORIDE: 104 mmol/L (ref 96–106)
CO2: 26 mmol/L (ref 20–29)
Calcium: 9.4 mg/dL (ref 8.7–10.2)
Creatinine, Ser: 0.73 mg/dL (ref 0.57–1.00)
GFR calc Af Amer: 106 mL/min/{1.73_m2} (ref >59)
GFR calc non Af Amer: 92 mL/min/{1.73_m2} (ref >59)
Glucose: 58 mg/dL — ABNORMAL LOW (ref 65–99)
Potassium: 4.3 mmol/L (ref 3.5–5.2)
Sodium: 145 mmol/L — ABNORMAL HIGH (ref 134–144)

## 2017-03-18 LAB — CBC
HEMATOCRIT: 42.5 % (ref 34.0–46.6)
Hemoglobin: 14.1 g/dL (ref 11.1–15.9)
MCH: 29.9 pg (ref 26.6–33.0)
MCHC: 33.2 g/dL (ref 31.5–35.7)
MCV: 90 fL (ref 79–97)
Platelets: 229 10*3/uL (ref 150–379)
RBC: 4.72 x10E6/uL (ref 3.77–5.28)
RDW: 13.9 % (ref 12.3–15.4)
WBC: 4.8 10*3/uL (ref 3.4–10.8)

## 2017-03-18 LAB — TSH: TSH: 1.95 u[IU]/mL (ref 0.450–4.500)

## 2017-04-06 ENCOUNTER — Ambulatory Visit: Payer: 59 | Admitting: Family Medicine

## 2017-10-01 ENCOUNTER — Encounter (HOSPITAL_COMMUNITY): Payer: Self-pay | Admitting: Emergency Medicine

## 2017-10-01 ENCOUNTER — Other Ambulatory Visit: Payer: Self-pay

## 2017-10-01 DIAGNOSIS — R51 Headache: Secondary | ICD-10-CM | POA: Diagnosis not present

## 2017-10-01 DIAGNOSIS — Z5321 Procedure and treatment not carried out due to patient leaving prior to being seen by health care provider: Secondary | ICD-10-CM | POA: Diagnosis not present

## 2017-10-01 NOTE — ED Triage Notes (Signed)
Pt was cleaning a room where she states she ran into a large cabinet door that broke and hit her in the head. No active bleeding at site. Pt has no neurologic deficits but reports head pain from where she was hit.

## 2017-10-02 ENCOUNTER — Emergency Department (HOSPITAL_COMMUNITY)
Admission: EM | Admit: 2017-10-02 | Discharge: 2017-10-02 | Disposition: A | Discharge status: Home / Self Care | Payer: 59 | Attending: Emergency Medicine | Admitting: Emergency Medicine

## 2017-10-02 NOTE — ED Notes (Signed)
Pt left without being seen by a doctor.

## 2017-10-08 ENCOUNTER — Emergency Department (HOSPITAL_COMMUNITY)
Admission: EM | Admit: 2017-10-08 | Discharge: 2017-10-08 | Disposition: A | Discharge status: Home / Self Care | Payer: PRIVATE HEALTH INSURANCE | Attending: Emergency Medicine | Admitting: Emergency Medicine

## 2017-10-08 ENCOUNTER — Encounter (HOSPITAL_COMMUNITY): Payer: Self-pay | Admitting: Emergency Medicine

## 2017-10-08 DIAGNOSIS — S134XXA Sprain of ligaments of cervical spine, initial encounter: Secondary | ICD-10-CM | POA: Diagnosis not present

## 2017-10-08 DIAGNOSIS — Y9289 Other specified places as the place of occurrence of the external cause: Secondary | ICD-10-CM | POA: Diagnosis not present

## 2017-10-08 DIAGNOSIS — S060X0A Concussion without loss of consciousness, initial encounter: Secondary | ICD-10-CM | POA: Insufficient documentation

## 2017-10-08 DIAGNOSIS — S139XXA Sprain of joints and ligaments of unspecified parts of neck, initial encounter: Secondary | ICD-10-CM | POA: Diagnosis not present

## 2017-10-08 DIAGNOSIS — Y990 Civilian activity done for income or pay: Secondary | ICD-10-CM | POA: Diagnosis not present

## 2017-10-08 DIAGNOSIS — W228XXA Striking against or struck by other objects, initial encounter: Secondary | ICD-10-CM | POA: Diagnosis not present

## 2017-10-08 DIAGNOSIS — Y939 Activity, unspecified: Secondary | ICD-10-CM | POA: Diagnosis not present

## 2017-10-08 DIAGNOSIS — R51 Headache: Secondary | ICD-10-CM | POA: Diagnosis not present

## 2017-10-08 DIAGNOSIS — S0990XA Unspecified injury of head, initial encounter: Secondary | ICD-10-CM | POA: Diagnosis present

## 2017-10-08 MED ORDER — CYCLOBENZAPRINE HCL 10 MG PO TABS: 10.0000 mg | ORAL_TABLET | Freq: Two times a day (BID) | ORAL | 0 refills | Status: DC | PRN

## 2017-10-08 NOTE — ED Triage Notes (Signed)
Pt presents to ED for assessment of left sided headache after a head injury last week.  Sent by her PCP for increasing headache and now neck pain.  Denies dizziness, denies n/v.

## 2017-10-08 NOTE — ED Provider Notes (Signed)
[facility] EMERGENCY DEPARTMENT Provider Note   CSN: [id number] Arrival date & time: 10/08/17  1647   History   Chief Complaint Chief Complaint  Patient presents with  . Head Injury    HPI Kathryn Lester is a 57 y.o. female.  HPI    57 year old female presents today with complaints of neck pain and headache.  Patient reports that approximately 7 days ago she was at work when a door swung open and hit her on the left side of the head.  Patient notes seeing black but had no loss of consciousness.  Since that time she has had an pain down the left lateral neck with radiation into the arm.  She denies any acute neurological deficits.  Patient reports taking Advil at home with very minimal improvement in her symptoms.  Patient reports that she does not take anticoagulants.   History reviewed. No pertinent past medical history.  Patient Active Problem List   Diagnosis Date Noted  . KNEE PAIN, RIGHT 07/04/2008  . SPRAIN AND STRAIN OF SACROILIAC 07/04/2008    Past Surgical History:  Procedure Laterality Date  . CESAREAN SECTION      OB History    No data available       Home Medications    Prior to Admission medications   Medication Sig Start Date End Date Taking? Authorizing Provider  cyclobenzaprine (FLEXERIL) 10 MG tablet Take 1 tablet (10 mg total) by mouth 2 (two) times daily as needed for muscle spasms. 10/08/17   Mandee Pluta, Tinnie Gens, PA-C  hydrOXYzine (ATARAX/VISTARIL) 25 MG tablet Take 0.5-1 tablets (12.5-25 mg total) by mouth 3 (three) times daily as needed for anxiety (or sleep). 03/17/17   Shade Flood, MD    Family History History reviewed. No pertinent family history.  Social History Social History   Tobacco Use  . Smoking status: Never Smoker  . Smokeless tobacco: Never Used  Substance Use Topics  . Alcohol use: No  . Drug use: No     Allergies   Patient has no known allergies.   Review of Systems Review of Systems    All other systems reviewed and are negative.    Physical Exam Updated Vital Signs BP 140/82 (BP Location: Right Arm)   Pulse 69   Temp 98.1 F (36.7 C) (Oral)   Resp 18   LMP 12/08/2011   SpO2 99%   Physical Exam  Constitutional: She is oriented to person, place, and time. She appears well-developed and well-nourished.  HENT:  Head: Normocephalic and atraumatic.  Eyes: Conjunctivae are normal. Pupils are equal, round, and reactive to light. Right eye exhibits no discharge. Left eye exhibits no discharge. No scleral icterus.  Neck: Normal range of motion. No JVD present. No tracheal deviation present.  Pulmonary/Chest: Effort normal. No stridor.  Musculoskeletal:  No CT spinal tenderness to palpation-tenderness palpation of the left trapezius and lateral neck musculature-full active range of motion of the left upper extremity without pain, grip strength 5 out of 5, sensation intact  Neurological: She is alert and oriented to person, place, and time. No cranial nerve deficit or sensory deficit. She exhibits normal muscle tone. Coordination normal.  Skin: Skin is warm.  Psychiatric: She has a normal mood and affect. Her behavior is normal. Judgment and thought content normal.  Nursing note and vitals reviewed.    ED Treatments / Results  Labs (all labs ordered are listed, but only abnormal results are displayed) Labs Reviewed - No data to display  EKG  EKG Interpretation None       Radiology No results found.  Procedures Procedures (including critical care time)  Medications Ordered in ED Medications - No data to display   Initial Impression / Assessment and Plan / ED Course  I have reviewed the triage vital signs and the nursing notes.  Pertinent labs & imaging results that were available during my care of the patient were reviewed by me and considered in my medical decision making (see chart for details).      Final Clinical Impressions(s) / ED Diagnoses    Final diagnoses:  Neck sprain, initial encounter  Concussion without loss of consciousness, initial encounter    Labs:   Imaging:  Consults:  Therapeutics:  Discharge Meds: Flexeril   Assessment/Plan: 57 year old female presents today with neck and head pain.  Patient is well-appearing in no acute distress, she has no acute neurological deficit.  I discussed the low likelihood of any intracranial abnormality, patient agreed and did not want any further imaging of the head including CT scan.  Patient's pain is likely secondary to postconcussive syndrome and/or muscular strain of the neck.  Patient will be given a short course of muscle relaxers, encouraged to use over-the-counter medication as needed for headache, and return immediately with any new or worsening signs or symptoms patient verbalized understanding and agreement to today's plan had no further questions or concerns at the time of discharge.      ED Discharge Orders        Ordered    cyclobenzaprine (FLEXERIL) 10 MG tablet  2 times daily PRN     10/08/17 2053       Rosalio LoudHedges, Katee Wentland, PA-C 10/08/17 2117    Mancel BaleWentz, Elliott, MD 10/09/17 1046

## 2017-10-08 NOTE — Discharge Instructions (Signed)
Please read attached information. If you experience any new or worsening signs or symptoms please return to the emergency room for evaluation. Please follow-up with your primary care provider or specialist as discussed. Please use medication prescribed only as directed and discontinue taking if you have any concerning signs or symptoms.   °

## 2017-10-09 DIAGNOSIS — H5203 Hypermetropia, bilateral: Secondary | ICD-10-CM | POA: Diagnosis not present

## 2018-01-04 DIAGNOSIS — Z0000 Encounter for general adult medical examination without abnormal findings: Secondary | ICD-10-CM | POA: Diagnosis not present

## 2018-01-04 DIAGNOSIS — R5383 Other fatigue: Secondary | ICD-10-CM | POA: Diagnosis not present

## 2018-01-04 DIAGNOSIS — R0602 Shortness of breath: Secondary | ICD-10-CM | POA: Diagnosis not present

## 2018-01-04 DIAGNOSIS — E559 Vitamin D deficiency, unspecified: Secondary | ICD-10-CM | POA: Diagnosis not present

## 2018-01-04 DIAGNOSIS — Z114 Encounter for screening for human immunodeficiency virus [HIV]: Secondary | ICD-10-CM | POA: Diagnosis not present

## 2018-01-18 DIAGNOSIS — K219 Gastro-esophageal reflux disease without esophagitis: Secondary | ICD-10-CM | POA: Diagnosis not present

## 2018-01-18 DIAGNOSIS — R899 Unspecified abnormal finding in specimens from other organs, systems and tissues: Secondary | ICD-10-CM | POA: Diagnosis not present

## 2018-01-18 DIAGNOSIS — R7303 Prediabetes: Secondary | ICD-10-CM | POA: Diagnosis not present

## 2019-02-21 ENCOUNTER — Ambulatory Visit (INDEPENDENT_AMBULATORY_CARE_PROVIDER_SITE_OTHER): Payer: 59 | Admitting: Internal Medicine

## 2019-02-21 ENCOUNTER — Other Ambulatory Visit: Payer: Self-pay

## 2019-02-21 ENCOUNTER — Encounter: Payer: Self-pay | Admitting: Internal Medicine

## 2019-02-21 DIAGNOSIS — G8929 Other chronic pain: Secondary | ICD-10-CM

## 2019-02-21 DIAGNOSIS — R030 Elevated blood-pressure reading, without diagnosis of hypertension: Secondary | ICD-10-CM

## 2019-02-21 DIAGNOSIS — Z0000 Encounter for general adult medical examination without abnormal findings: Secondary | ICD-10-CM | POA: Diagnosis not present

## 2019-02-21 DIAGNOSIS — M2550 Pain in unspecified joint: Secondary | ICD-10-CM

## 2019-02-21 NOTE — Patient Instructions (Addendum)
Thank you for allowing [place] to care for you  For your joint pain - Trial of Ibuprofen as needed to for pain - Apply heat and trial of massage for tight muscles  For elevated blood pressure - We will recheck on follow up and may discuss medicines at that  Lab Work today, you will be contacted with the results  Please follow up in about 3 months with PCP

## 2019-02-21 NOTE — Assessment & Plan Note (Addendum)
BP elevated on first check today to 150/87. This is her first elevated BP and there may be a component of white coat hypertension. BP in ED in 9767 was 341 Systolic.  - Continue to monitor - Recheck at follow up in 3 months

## 2019-02-21 NOTE — Progress Notes (Signed)
   CC: Establish care, Joint pain  HPI:  [name] is a 58 y.o. F with PMHx listed below presenting for Establish care, Joint pain. Please see the A&P for the status of the patient's chronic medical problems.  No past medical history on file.  Reviewed and updated.  Past Surgical History:  Procedure Laterality Date  . CESAREAN SECTION     Social History   Socioeconomic History  . Marital status: Married    Spouse name: Not on file  . Number of children: Not on file  . Years of education: Not on file  . Highest education level: Not on file  Occupational History  . Not on file  Social Needs  . Financial resource strain: Not on file  . Food insecurity    Worry: Not on file    Inability: Not on file  . Transportation needs    Medical: Not on file    Non-medical: Not on file  Tobacco Use  . Smoking status: Never Smoker  . Smokeless tobacco: Never Used  Substance and Sexual Activity  . Alcohol use: No  . Drug use: No  . Sexual activity: Yes    Birth control/protection: Condom  Lifestyle  . Physical activity    Days per week: Not on file    Minutes per session: Not on file  . Stress: Not on file  Relationships  . Social Herbalist on phone: Not on file    Gets together: Not on file    Attends religious service: Not on file    Active member of club or organization: Not on file    Attends meetings of clubs or organizations: Not on file    Relationship status: Not on file  Other Topics Concern  . Not on file  Social History Narrative  . Not on file   Family History  Problem Relation Age of Onset  . Hypertension Mother     Review of Systems:  Performed and all others negative.  Physical Exam:  Vitals:   02/21/19 1342  BP: (!) 150/87  Pulse: 72  Temp: 98.5 F (36.9 C)  TempSrc: Oral  SpO2: 100%  Weight: 149 lb 8 oz (67.8 kg)  Height: 5' 2.5" (1.588 m)   Physical Exam Constitutional:      General: She is not in acute distress.   Appearance: Normal appearance.  Cardiovascular:     Rate and Rhythm: Normal rate and regular rhythm.     Pulses: Normal pulses.     Heart sounds: Normal heart sounds.  Pulmonary:     Effort: Pulmonary effort is normal. No respiratory distress.     Breath sounds: Normal breath sounds.  Abdominal:     General: Bowel sounds are normal. There is no distension.     Palpations: Abdomen is soft.     Tenderness: There is no abdominal tenderness.  Musculoskeletal:        General: No swelling or deformity.     Comments: Small swelling/nodule at base of L 3rd MCP  Skin:    General: Skin is warm and dry.  Neurological:     General: No focal deficit present.     Mental Status: Mental status is at baseline.     Assessment & Plan:   See Encounters Tab for problem based charting.  Patient discussed with Dr. Rebeca Alert

## 2019-02-21 NOTE — Assessment & Plan Note (Signed)
Patient reports chronic joint pain that is worse throughout they day as she works. She states treat the pain seems to improve when she massages the joint. She has not tried and medications. She plans to see a massage therapist for regular treatment to see if this helps. Will trial PRN Ibuprofen as well. - Agree with trial of massage therapy - Ibuprofen PRN

## 2019-02-22 ENCOUNTER — Encounter: Payer: Self-pay | Admitting: Internal Medicine

## 2019-02-22 LAB — BMP8+ANION GAP
Anion Gap: 16 mmol/L (ref 10.0–18.0)
BUN/Creatinine Ratio: 16 (ref 9–23)
BUN: 15 mg/dL (ref 6–24)
CO2: 25 mmol/L (ref 20–29)
Calcium: 9.4 mg/dL (ref 8.7–10.2)
Chloride: 100 mmol/L (ref 96–106)
Creatinine, Ser: 0.94 mg/dL (ref 0.57–1.00)
GFR calc Af Amer: 77 mL/min/{1.73_m2} (ref >59)
GFR calc non Af Amer: 67 mL/min/{1.73_m2} (ref >59)
Glucose: 96 mg/dL (ref 65–99)
Potassium: 4.3 mmol/L (ref 3.5–5.2)
Sodium: 141 mmol/L (ref 134–144)

## 2019-02-22 LAB — CBC
Hematocrit: 38.2 % (ref 34.0–46.6)
Hemoglobin: 13 g/dL (ref 11.1–15.9)
MCH: 30.1 pg (ref 26.6–33.0)
MCHC: 34 g/dL (ref 31.5–35.7)
MCV: 88 fL (ref 79–97)
Platelets: 228 10*3/uL (ref 150–450)
RBC: 4.32 x10E6/uL (ref 3.77–5.28)
RDW: 12.5 % (ref 11.7–15.4)
WBC: 8.3 10*3/uL (ref 3.4–10.8)

## 2019-02-22 NOTE — Progress Notes (Signed)
Letter sent informing patient of normal BMP and CBC

## 2019-02-22 NOTE — Assessment & Plan Note (Signed)
2 years since patient has had lab work. - BMP - CBC  ADDENDUM: Lab results are WNL.

## 2019-03-07 NOTE — Progress Notes (Signed)
Internal Medicine Clinic Attending  Case discussed with Dr. Melvin at the time of the visit.  We reviewed the resident's history and exam and pertinent patient test results.  I agree with the assessment, diagnosis, and plan of care documented in the resident's note.  Alexander Raines, M.D., Ph.D.  

## 2019-10-05 DIAGNOSIS — L811 Chloasma: Secondary | ICD-10-CM | POA: Diagnosis not present

## 2019-10-18 MED FILL — METHOCARBAMOL 500 MG TABS: 500 | 10 days supply | Qty: 30 | Fill #0

## 2019-11-09 ENCOUNTER — Other Ambulatory Visit: Payer: Self-pay | Admitting: Family Medicine

## 2019-11-09 ENCOUNTER — Ambulatory Visit: Payer: Self-pay

## 2019-11-09 ENCOUNTER — Other Ambulatory Visit: Payer: Self-pay

## 2019-11-09 DIAGNOSIS — M545 Low back pain, unspecified: Secondary | ICD-10-CM

## 2019-11-16 ENCOUNTER — Ambulatory Visit: Payer: PRIVATE HEALTH INSURANCE | Attending: Family Medicine | Admitting: Physical Therapy

## 2019-11-16 ENCOUNTER — Other Ambulatory Visit: Payer: Self-pay

## 2019-11-16 ENCOUNTER — Encounter: Payer: Self-pay | Admitting: Physical Therapy

## 2019-11-16 DIAGNOSIS — M6281 Muscle weakness (generalized): Secondary | ICD-10-CM | POA: Diagnosis present

## 2019-11-16 DIAGNOSIS — M545 Low back pain: Secondary | ICD-10-CM | POA: Diagnosis present

## 2019-11-16 DIAGNOSIS — M62830 Muscle spasm of back: Secondary | ICD-10-CM | POA: Insufficient documentation

## 2019-11-16 DIAGNOSIS — G8929 Other chronic pain: Secondary | ICD-10-CM | POA: Diagnosis present

## 2019-11-16 NOTE — Therapy (Signed)
[place], [place], [address] Phone: [phone]   Fax:  [phone]  Physical Therapy Evaluation  Patient Details  Name: Marguerette Sheller MRN: [id number] Date of Birth: May 29, 1961 Referring Provider (PT): Odis Luster, MD    Encounter Date: 11/16/2019  PT End of Session - 11/16/19 1414    Visit Number  1    Number of Visits  11    Date for PT Re-Evaluation  12/28/19    Authorization Type  workers Comp - Case Manager : Chip Boer  [phone] authorized 1 eval + 10 visits.    Authorization - Number of Visits  10    PT Start Time  6203    PT Stop Time  1500    PT Time Calculation (min)  45 min    Activity Tolerance  Patient tolerated treatment well    Behavior During Therapy  WFL for tasks assessed/performed       Past Medical History:  Diagnosis Date  . Depression    self reported    Past Surgical History:  Procedure Laterality Date  . TUBAL LIGATION  1995    There were no vitals filed for this visit.   Subjective Assessment - 11/16/19 1423    Subjective  pt is a 59 y.o with CC or low back pain that started when she was at work and slipped falling forward, she pushed her self up and stood up and resumed working and at the end of the day she went to clock out and couldn't stand up which occured on 10/13/2019. pain stays in the low back and stays on the R with occasional referral to the L. She describes it as a pinchin sensation that occurs with laying down and prolonged sitting. She denies any red flags or LE N/T.    Limitations  Lifting    How long can you sit comfortably?  30 min    How long can you stand comfortably?  8 hours ( but is uncomfortable)    How long can you walk comfortably?  8 hours ( but is uncomfortable)    Diagnostic tests  11/09/2019 IMPRESSION:Negative exam    Patient Stated Goals  to decrease pain, and improve function    Currently in Pain?  Yes    Pain  Score  6    at worst 7-8/10   Pain Location  Back    Pain Orientation  Right    Pain Descriptors / Indicators  Sharp;Aching    Pain Type  Chronic pain    Pain Onset  More than a month ago    Pain Frequency  Constant    Aggravating Factors   laying down, bending forward, getting up after sitting down for a while    Pain Relieving Factors  walking, stretching,    Effect of Pain on Daily Activities  limited endurance/ ROM         [facility] PT Assessment - 11/16/19 1432      Assessment   Medical Diagnosis   Low back pain M54.5    Referring Provider (PT)  Odis Luster, MD     Onset Date/Surgical Date  10/13/19    Hand Dominance  Right    Next MD Visit  12/18/2019    Prior Therapy  no      Precautions   Precautions  Back    Precaution Comments  no lifting over 10#      Restrictions   Weight Bearing Restrictions  No  Balance Screen   Has the patient fallen in the past 6 months  Yes    How many times?  1    Has the patient had a decrease in activity level because of a fear of falling?   No    Is the patient reluctant to leave their home because of a fear of falling?   No      Home Film/video editor residence      Prior Function   Level of Independence  Independent with basic ADLs    Vocation  Full time employment   house keeping   Vocation Requirements  standing/ walking, lifting mechanics      Cognition   Overall Cognitive Status  Within Functional Limits for tasks assessed      Observation/Other Assessments   Focus on Therapeutic Outcomes (FOTO)   53% limited   predicted 40% limited     Posture/Postural Control   Posture/Postural Control  Postural limitations    Postural Limitations  Forward head      ROM / Strength   AROM / PROM / Strength  AROM;Strength      AROM   AROM Assessment Site  Lumbar    Lumbar Flexion  108   end range pain   Lumbar Extension  18    Lumbar - Right Side Bend  8    Lumbar - Left Side Bend  10       Strength   Strength Assessment Site  Hip;Knee    Right/Left Hip  Right;Left    Right Hip Flexion  4/5    Right Hip Extension  4/5    Right Hip ABduction  4/5    Left Hip Flexion  4+/5    Left Hip ABduction  4/5    Right/Left Knee  Right;Left    Right Knee Flexion  4+/5    Right Knee Extension  4+/5    Left Knee Flexion  4+/5    Left Knee Extension  4+/5      Palpation   Palpation comment  TTP along the R lumbar paraspinals       Special Tests    Special Tests  Lumbar;Sacrolliac Tests    Lumbar Tests  Straight Leg Raise    Sacroiliac Tests   --      Sacral Compression   Findings  Not tested      Ambulation/Gait   Ambulation/Gait  Yes    Gait Pattern  Within Functional Limits                Objective measurements completed on examination: See above findings.      [facility] Adult PT Treatment/Exercise - 11/16/19 1432      Exercises   Exercises  Knee/Hip;Lumbar      Knee/Hip Exercises: Stretches   Active Hamstring Stretch  3 reps;30 seconds   PNF contract/ relax     Knee/Hip Exercises: Supine   Straight Leg Raises  Left;2 sets;10 reps      Manual Therapy   Manual Therapy  Joint mobilization;Muscle Energy Technique    Joint Mobilization  LAD grade V     Muscle Energy Technique  R hip flexor activation 1 x 10 holding 10 seconds             PT Education - 11/16/19 1501    Education Details  evaluation findings, POC, goals, HEP with proper form/ Rationale.    Person(s) Educated  Patient    Methods  Explanation;Verbal cues;Handout    Comprehension  Verbalized understanding;Verbal cues required       PT Short Term Goals - 11/16/19 1508      PT SHORT TERM GOAL #1   Title  pt to be I with intial HEP    Time  3    Period  Weeks    Status  New    Target Date  12/07/19      PT SHORT TERM GOAL #2   Title  pt to verbalize / demo proper posture and lifting mechanics to reduce and prevent low back pain    Time  3    Period  Weeks    Status  New     Target Date  12/07/19        PT Long Term Goals - 11/16/19 1508      PT LONG TERM GOAL #1   Title  increase trunk extension and bil sidebending by >/= 10 degrees to promote functional ROM required for ADLS    Time  5    Period  Weeks    Status  New    Target Date  12/21/19      PT LONG TERM GOAL #2   Title  pt to be able to walk/ stand for >/= 5-6 hours with </= 1/10 pain for functional endurance required for work related activities    Time  5    Period  Weeks    Status  New    Target Date  12/21/19      PT LONG TERM GOAL #3   Title  pt to increase bil hip strength to >/= 4+/5 to promote functional lifting mechanics    Time  5    Period  Weeks    Status  New    Target Date  12/21/19      PT LONG TERM GOAL #4   Title  increase FOTO score to </=40% limited to demo improvement in function    Time  5    Period  Weeks    Status  New    Target Date  12/21/19      PT LONG TERM GOAL #5   Title  pt to be I with all HEP given as of last visit to maintain and progress current level of function    Time  5    Period  Weeks    Status  New    Target Date  12/21/19             Plan - 11/16/19 1501    Clinical Impression Statement  pt presents to OPPT with CC of low back pain after falling forward while at work on 10/13/2019. She demonstrates functional trunk flexion with mild limitation with extension and sidebending with pain noted throuhgout all moitons. MMT revealed weakness in the R hip flexors and bil abductors/ extensors. special testing suggest high liklihood of R SIJ invovlement combined with lumbar paraspinals. She responded well to hip long axis distraction, and hamstring stretching followed with R hip flexor METs. She would benefit from physical therapy to decrase R low back pain, reduce spasm, increase strength and return to PLOF by addressing the deficits listed.    Personal Factors and Comorbidities  Social Background    Examination-Activity Limitations   Bend;Stand;Locomotion Level    Stability/Clinical Decision Making  Evolving/Moderate complexity    Clinical Decision Making  Moderate    Rehab Potential  Good    PT Frequency  2x / week  PT Duration  --   5 weeks   PT Treatment/Interventions  ADLs/Self Care Home Management;Electrical Stimulation;Iontophoresis 47m/ml Dexamethasone;Cryotherapy;Moist Heat;Traction;Ultrasound;Gait training;Therapeutic activities;Therapeutic exercise;Balance training;Neuromuscular re-education;Manual techniques;Dry needling;Passive range of motion;Taping;Patient/family education;Spinal Manipulations    PT Next Visit Plan  review/ update HEP PRN, potential SIJ ant rotation deficit: hamstring stretching, hip flexor strengthening, core strength, STW, discuss DN?, posture/ lifting mechanics    PT Home Exercise Plan  [id number]- lower trunk rotation, hamstring stretching (supine and seated), SLR, self MET for R hip flexor    Consulted and Agree with Plan of Care  Patient       Patient will benefit from skilled therapeutic intervention in order to improve the following deficits and impairments:  Improper body mechanics, Increased muscle spasms, Decreased strength, Postural dysfunction, Pain, Decreased activity tolerance, Decreased endurance, Decreased range of motion  Visit Diagnosis: Chronic right-sided low back pain without sciatica  Muscle spasm of back  Muscle weakness (generalized)     Problem List Patient Active Problem List   Diagnosis Date Noted  . Elevated blood pressure reading 02/21/2019  . Joint pain 02/21/2019  . Preventative health care 02/21/2019  . KNEE PAIN, RIGHT 07/04/2008  . SPRAIN AND STRAIN OF SACROILIAC 07/04/2008   KStarr LakePT, DPT, LAT, ATC  11/16/19  4:55 PM      [facility] Center[address]GCotton[place] [address]Phone: [phone]  Fax:  [phone] Name: ECarmina WalleMRN: [id number] of Birth:  106-21-1962

## 2019-11-29 ENCOUNTER — Telehealth: Payer: Self-pay | Admitting: Physical Therapy

## 2019-11-29 ENCOUNTER — Ambulatory Visit: Payer: PRIVATE HEALTH INSURANCE | Attending: Family Medicine | Admitting: Physical Therapy

## 2019-11-29 NOTE — Telephone Encounter (Signed)
Patient contacted regarding missed visit. She reports she forgot. She is aware of her next visit but has another appointment and wont be able to make it. She is aware of her next visit on 8/13

## 2019-12-01 ENCOUNTER — Ambulatory Visit: Payer: 59 | Admitting: Physical Therapy

## 2019-12-06 ENCOUNTER — Other Ambulatory Visit: Payer: Self-pay

## 2019-12-06 ENCOUNTER — Encounter: Payer: Self-pay | Admitting: Physical Therapy

## 2019-12-06 ENCOUNTER — Ambulatory Visit: Payer: PRIVATE HEALTH INSURANCE | Attending: Family Medicine | Admitting: Physical Therapy

## 2019-12-06 ENCOUNTER — Ambulatory Visit: Payer: PRIVATE HEALTH INSURANCE | Admitting: Physical Therapy

## 2019-12-06 DIAGNOSIS — M62830 Muscle spasm of back: Secondary | ICD-10-CM | POA: Insufficient documentation

## 2019-12-06 DIAGNOSIS — M6281 Muscle weakness (generalized): Secondary | ICD-10-CM

## 2019-12-06 DIAGNOSIS — M545 Low back pain: Secondary | ICD-10-CM | POA: Insufficient documentation

## 2019-12-06 DIAGNOSIS — G8929 Other chronic pain: Secondary | ICD-10-CM | POA: Insufficient documentation

## 2019-12-06 NOTE — Patient Instructions (Signed)
Access Code: [email or web address] URL: [email or web address] Date: 12/06/2019 Prepared by: Rosana Hoes  Exercises Lower Trunk Rotation - 1 x daily - 7 x weekly - 10 reps - 2 sets Supine Piriformis Stretch with Foot on Ground - 1 x daily - 7 x weekly - 2 reps - 20 seconds hold Seated Hamstring Stretch - 2 x daily - 7 x weekly - 2 sets - 2 reps - 30 hold Supine Hamstring Stretch with Strap - 2 x daily - 7 x weekly - 2 sets - 2 reps - 30 hold Hooklying Sacroiliac Joint Isometric - 1 x daily - 7 x weekly - 2 sets - 10 reps - 10 seconds hold Supine Active Straight Leg Raise - 1 x daily - 7 x weekly - 10 reps - 3 sets Supine Bridge - 1 x daily - 7 x weekly - 3 sets - 10 reps Sidelying Hip Abduction - 1 x daily - 7 x weekly - 3 sets - 10 reps Supine 90/90 Alternating Toe Touch - 1 x daily - 7 x weekly - 3 sets - 10 reps

## 2019-12-06 NOTE — Therapy (Signed)
[place], [place], [address] Phone: [phone]   Fax:  [phone]  Physical Therapy Treatment  Patient Details  Name: Kathryn Lester MRN: [id number] Date of Birth: 07/10/1961 Referring Provider (PT): Odis Luster, MD    Encounter Date: 12/06/2019  PT End of Session - 12/06/19 1103    Visit Number  2    Number of Visits  11    Date for PT Re-Evaluation  12/28/19    Authorization Type  workers Comp - Case Manager : Chip Boer  [phone] authorized 1 eval + 10 visits.    Authorization - Visit Number  1    Authorization - Number of Visits  10    PT Start Time  6010    PT Stop Time  1128    PT Time Calculation (min)  41 min    Activity Tolerance  Patient tolerated treatment well    Behavior During Therapy  WFL for tasks assessed/performed       Past Medical History:  Diagnosis Date  . Depression    self reported    Past Surgical History:  Procedure Laterality Date  . TUBAL LIGATION  1995    There were no vitals filed for this visit.  Subjective Assessment - 12/06/19 1052    Subjective  Patient reports that she is feeling better, the exercises have helped. Patient is going to the going to the gym and doing home workouts. At the gym she is doing about 15 minutes of running and some stretching. She is still having occasional right sided back/hip pain.    Patient Stated Goals  to decrease pain, and improve function    Currently in Pain?  No/denies    Pain Score  0-No pain    Pain Location  Back    Pain Orientation  Right                       [facility] Adult PT Treatment/Exercise - 12/06/19 0001      Exercises   Exercises  Knee/Hip;Lumbar      Lumbar Exercises: Stretches   Passive Hamstring Stretch  3 reps;20 seconds    Passive Hamstring Stretch Limitations  supine    Lower Trunk Rotation  5 reps;10 seconds    Lower Trunk Rotation Limitations  cross legged     Piriformis Stretch  3 reps;20 seconds    Piriformis Stretch Limitations  supine      Lumbar Exercises: Aerobic   Nustep  L5 x 5 min with UE/LE      Lumbar Exercises: Supine   Other Supine Lumbar Exercises  90-90 alternating toe taps 3x10      Knee/Hip Exercises: Supine   Bridges  2 sets;10 reps    Straight Leg Raises  2 sets;10 reps      Knee/Hip Exercises: Sidelying   Hip ABduction  2 sets;10 reps    Hip ABduction Limitations  greater difficulty on right side             PT Education - 12/06/19 1054    Education Details  HEP update    Person(s) Educated  Patient    Methods  Explanation;Demonstration;Verbal cues;Handout;Tactile cues    Comprehension  Verbalized understanding;Returned demonstration;Verbal cues required;Need further instruction;Tactile cues required       PT Short Term Goals - 12/06/19 1133      PT SHORT TERM GOAL #1   Title  pt to be I with intial HEP  Time  3    Period  Weeks    Status  On-going    Target Date  12/07/19      PT SHORT TERM GOAL #2   Title  pt to verbalize / demo proper posture and lifting mechanics to reduce and prevent low back pain    Time  3    Period  Weeks    Status  On-going    Target Date  12/07/19        PT Long Term Goals - 11/16/19 1508      PT LONG TERM GOAL #1   Title  increase trunk extension and bil sidebending by >/= 10 degrees to promote functional ROM required for ADLS    Time  5    Period  Weeks    Status  New    Target Date  12/21/19      PT LONG TERM GOAL #2   Title  pt to be able to walk/ stand for >/= 5-6 hours with </= 1/10 pain for functional endurance required for work related activities    Time  5    Period  Weeks    Status  New    Target Date  12/21/19      PT LONG TERM GOAL #3   Title  pt to increase bil hip strength to >/= 4+/5 to promote functional lifting mechanics    Time  5    Period  Weeks    Status  New    Target Date  12/21/19      PT LONG TERM GOAL #4   Title   increase FOTO score to </=40% limited to demo improvement in function    Time  5    Period  Weeks    Status  New    Target Date  12/21/19      PT LONG TERM GOAL #5   Title  pt to be I with all HEP given as of last visit to maintain and progress current level of function    Time  5    Period  Weeks    Status  New    Target Date  12/21/19            Plan - 12/06/19 1058    Clinical Impression Statement  Patient tolerated therapy well with no advers effects. She seems to be improving so her core and hip strengthening was progressed this visit, also reviewed previous HEP with good tolerance and understanding. She does report the righ hip/lower back feels tighter with stretching and she had greater difficulty with glute strengthening on right. No pain reported this visit. She did require consistent cueing for proper exercise technique to avoid compensation. She would benefit from continued skilled physical therapy to decrase R low back pain, reduce spasm, increase strength and return to PLOF.    PT Treatment/Interventions  ADLs/Self Care Home Management;Electrical Stimulation;Iontophoresis 75m/ml Dexamethasone;Cryotherapy;Moist Heat;Traction;Ultrasound;Gait training;Therapeutic activities;Therapeutic exercise;Balance training;Neuromuscular re-education;Manual techniques;Dry needling;Passive range of motion;Taping;Patient/family education;Spinal Manipulations    PT Next Visit Plan  review/ update HEP PRN, potential SIJ ant rotation deficit: hamstring stretching, hip flexor strengthening, core strength, STW, discuss DN?, posture/ lifting mechanics    PT Home Exercise Plan  [id number]- lower trunk rotation, supine piriformis stretch, hamstring stretching (supine and seated), SLR, self MET for R hip flexor, bridge, sidelying hip abduction, supine 90-90 alternating toe taps    Consulted and Agree with Plan of Care  Patient       Patient will  benefit from skilled therapeutic intervention in order to  improve the following deficits and impairments:  Improper body mechanics, Increased muscle spasms, Decreased strength, Postural dysfunction, Pain, Decreased activity tolerance, Decreased endurance, Decreased range of motion  Visit Diagnosis: Chronic right-sided low back pain without sciatica  Muscle spasm of back  Muscle weakness (generalized)     Problem List Patient Active Problem List   Diagnosis Date Noted  . Elevated blood pressure reading 02/21/2019  . Joint pain 02/21/2019  . Preventative health care 02/21/2019  . KNEE PAIN, RIGHT 07/04/2008  . SPRAIN AND STRAIN OF SACROILIAC 07/04/2008    Hilda Blades, PT, DPT, LAT, ATC 12/06/19  11:36 AM Phone: [phone] Fax: [place] [facility] [address] [place], [place], [address] Phone: [phone]   Fax:  [phone]  Name: Delma Drone MRN: [id number] Date of Birth: 11-05-1960

## 2019-12-08 ENCOUNTER — Ambulatory Visit: Payer: PRIVATE HEALTH INSURANCE | Admitting: Physical Therapy

## 2019-12-13 ENCOUNTER — Other Ambulatory Visit: Payer: Self-pay

## 2019-12-13 ENCOUNTER — Ambulatory Visit: Payer: PRIVATE HEALTH INSURANCE | Admitting: Physical Therapy

## 2019-12-13 ENCOUNTER — Encounter: Payer: Self-pay | Admitting: Physical Therapy

## 2019-12-13 DIAGNOSIS — M6281 Muscle weakness (generalized): Secondary | ICD-10-CM

## 2019-12-13 DIAGNOSIS — M545 Low back pain: Secondary | ICD-10-CM | POA: Diagnosis not present

## 2019-12-13 DIAGNOSIS — M62830 Muscle spasm of back: Secondary | ICD-10-CM

## 2019-12-13 DIAGNOSIS — G8929 Other chronic pain: Secondary | ICD-10-CM

## 2019-12-13 NOTE — Therapy (Signed)
[place], [place], [address] Phone: [phone]   Fax:  [phone]  Physical Therapy Treatment  Patient Details  Name: Kathryn Lester MRN: [id number] Date of Birth: 09-16-1960 Referring Provider (PT): Odis Luster, MD    Encounter Date: 12/13/2019  PT End of Session - 12/13/19 1254    Visit Number  3    Number of Visits  11    Date for PT Re-Evaluation  12/28/19    Authorization Type  workers Comp - Case Manager : Chip Boer  [phone] authorized 1 eval + 10 visits.    PT Start Time  1145    PT Stop Time  1225    PT Time Calculation (min)  40 min    Activity Tolerance  Patient tolerated treatment well    Behavior During Therapy  WFL for tasks assessed/performed       Past Medical History:  Diagnosis Date  . Depression    self reported    Past Surgical History:  Procedure Laterality Date  . TUBAL LIGATION  1995    There were no vitals filed for this visit.  Subjective Assessment - 12/13/19 1151    Subjective  Patient reports minor pain in her hips today. She worked over the weekend. Overall she feels like her back is getting much better.    Limitations  Lifting    How long can you sit comfortably?  30 min    How long can you stand comfortably?  8 hours ( but is uncomfortable)    How long can you walk comfortably?  8 hours ( but is uncomfortable)    Diagnostic tests  11/09/2019 IMPRESSION:Negative exam    Patient Stated Goals  to decrease pain, and improve function    Currently in Pain?  Yes    Pain Score  2     Pain Location  Hip    Pain Orientation  Right;Left    Pain Descriptors / Indicators  Aching    Pain Type  Chronic pain    Pain Onset  More than a month ago    Pain Frequency  Intermittent    Aggravating Factors   standing, walking    Pain Relieving Factors  stretching    Effect of Pain on Daily Activities  limits endurance                        [facility] Adult PT Treatment/Exercise - 12/13/19 0001      Self-Care   Self-Care  Other Self-Care Comments    Other Self-Care Comments   discussed techniques for mopping , sweeping, and lifting at work to reduce stress on the back       Lumbar Exercises: Stretches   Passive Hamstring Stretch  3 reps;20 seconds    Passive Hamstring Stretch Limitations  supine    Lower Trunk Rotation  5 reps;10 seconds    Piriformis Stretch  3 reps;20 seconds      Lumbar Exercises: Aerobic   Nustep  L5 x 5 min with UE/LE      Lumbar Exercises: Standing   Functional Squats Limitations  reviewed squat technique with patient. Patient did well. Talked to patient about how to lift at work.     Other Standing Lumbar Exercises  Standing hip abcution 2x10 bilateral ; standing hip extension 2x10 bilateral       Lumbar Exercises: Supine   Clam Limitations  2x10 green     Bent  Knee Raise Limitations  2x10 did not put feet down     Bridge Limitations  x20       Knee/Hip Exercises: Supine   Bridges  2 sets;10 reps    Straight Leg Raises  2 sets;10 reps      Manual Therapy   Joint Mobilization  LAD grade V  to right only              PT Education - 12/13/19 1254    Education Details  reviewed HEP and symptom mangement    Person(s) Educated  Patient    Methods  Explanation;Demonstration;Tactile cues;Verbal cues    Comprehension  Verbalized understanding;Returned demonstration;Tactile cues required;Verbal cues required       PT Short Term Goals - 12/13/19 1332      PT SHORT TERM GOAL #1   Title  pt to be I with intial HEP    Time  3    Period  Weeks    Status  On-going    Target Date  12/07/19      PT SHORT TERM GOAL #2   Title  pt to verbalize / demo proper posture and lifting mechanics to reduce and prevent low back pain    Time  3    Period  Weeks    Status  On-going    Target Date  12/07/19        PT Long Term Goals - 11/16/19 1508      PT LONG  TERM GOAL #1   Title  increase trunk extension and bil sidebending by >/= 10 degrees to promote functional ROM required for ADLS    Time  5    Period  Weeks    Status  New    Target Date  12/21/19      PT LONG TERM GOAL #2   Title  pt to be able to walk/ stand for >/= 5-6 hours with </= 1/10 pain for functional endurance required for work related activities    Time  5    Period  Weeks    Status  New    Target Date  12/21/19      PT LONG TERM GOAL #3   Title  pt to increase bil hip strength to >/= 4+/5 to promote functional lifting mechanics    Time  5    Period  Weeks    Status  New    Target Date  12/21/19      PT LONG TERM GOAL #4   Title  increase FOTO score to </=40% limited to demo improvement in function    Time  5    Period  Weeks    Status  New    Target Date  12/21/19      PT LONG TERM GOAL #5   Title  pt to be I with all HEP given as of last visit to maintain and progress current level of function    Time  5    Period  Weeks    Status  New    Target Date  12/21/19            Plan - 12/13/19 1330    Clinical Impression Statement  Patient is making good progress. She reported intial pain in her hips but had no report of increased pain with ther-ex. She had mild pain in her anterior hip with rotation. Therapy perfromed LAD to reduce pinching. Patient reported no real difference after treatment. She was given standing exercises  for home. Therapy also reviewed lifting technique with patient. The patient did well with minimal cuing.    Personal Factors and Comorbidities  Social Background    Examination-Activity Limitations  Bend;Stand;Locomotion Level    Stability/Clinical Decision Making  Evolving/Moderate complexity    Clinical Decision Making  Moderate    Rehab Potential  Good    PT Frequency  2x / week    PT Duration  4 weeks    PT Treatment/Interventions  ADLs/Self Care Home Management;Electrical Stimulation;Iontophoresis 2m/ml  Dexamethasone;Cryotherapy;Moist Heat;Traction;Ultrasound;Gait training;Therapeutic activities;Therapeutic exercise;Balance training;Neuromuscular re-education;Manual techniques;Dry needling;Passive range of motion;Taping;Patient/family education;Spinal Manipulations    PT Next Visit Plan  review/ update HEP PRN, potential SIJ ant rotation deficit: hamstring stretching, hip flexor strengthening, core strength, STW, discuss DN?, posture/ lifting mechanics    PT Home Exercise Plan  [id number]- lower trunk rotation, supine piriformis stretch, hamstring stretching (supine and seated), SLR, self MET for R hip flexor, bridge, sidelying hip abduction, supine 90-90 alternating toe taps    Consulted and Agree with Plan of Care  Patient       Patient will benefit from skilled therapeutic intervention in order to improve the following deficits and impairments:  Improper body mechanics, Increased muscle spasms, Decreased strength, Postural dysfunction, Pain, Decreased activity tolerance, Decreased endurance, Decreased range of motion  Visit Diagnosis: Chronic right-sided low back pain without sciatica  Muscle spasm of back  Muscle weakness (generalized)     Problem List Patient Active Problem List   Diagnosis Date Noted  . Elevated blood pressure reading 02/21/2019  . Joint pain 02/21/2019  . Preventative health care 02/21/2019  . KNEE PAIN, RIGHT 07/04/2008  . SPRAIN AND STRAIN OF SACROILIAC 07/04/2008    DCarney LivingPT DPT  12/13/2019, 3:26 PM  [facility] [address]Phone: [phone]  Fax:  [phone] Name: EDaisha FilosaMRN: [id number] of Birth: 1December 14, 1962

## 2019-12-15 ENCOUNTER — Encounter: Payer: 59 | Admitting: Physical Therapy

## 2019-12-19 ENCOUNTER — Ambulatory Visit: Payer: 59 | Admitting: Dermatology

## 2019-12-20 ENCOUNTER — Ambulatory Visit: Payer: PRIVATE HEALTH INSURANCE | Admitting: Physical Therapy

## 2019-12-20 ENCOUNTER — Encounter: Payer: Self-pay | Admitting: Physical Therapy

## 2019-12-20 ENCOUNTER — Other Ambulatory Visit: Payer: Self-pay

## 2019-12-20 DIAGNOSIS — G8929 Other chronic pain: Secondary | ICD-10-CM

## 2019-12-20 DIAGNOSIS — M6281 Muscle weakness (generalized): Secondary | ICD-10-CM

## 2019-12-20 DIAGNOSIS — M545 Low back pain: Secondary | ICD-10-CM | POA: Diagnosis not present

## 2019-12-20 DIAGNOSIS — M62830 Muscle spasm of back: Secondary | ICD-10-CM

## 2019-12-20 NOTE — Therapy (Signed)
[place], [place], [address] Phone: [phone]   Fax:  [phone]  Physical Therapy Treatment/Discharge   Patient Details  Name: Kathryn Lester MRN: [id number] Date of Birth: 1961-07-14 Referring Provider (PT): Odis Luster, MD    Encounter Date: 12/20/2019  PT End of Session - 12/20/19 1208    Visit Number  4    Number of Visits  11    Date for PT Re-Evaluation  12/28/19    Authorization Type  workers Comp - Case Manager : Chip Boer  [phone] authorized 1 eval + 10 visits.    PT Start Time  1135    PT Stop Time  1215    PT Time Calculation (min)  40 min    Activity Tolerance  Patient tolerated treatment well    Behavior During Therapy  WFL for tasks assessed/performed       Past Medical History:  Diagnosis Date  . Depression    self reported    Past Surgical History:  Procedure Laterality Date  . TUBAL LIGATION  1995    There were no vitals filed for this visit.  Subjective Assessment - 12/20/19 1139    Subjective  Patient feels like her back is much bettter. She is having minor pain in her left hip when she wors and does housework. She feels pain mostly on the left hip. She is a little sore today.    Limitations  Lifting    How long can you sit comfortably?  30 min    How long can you stand comfortably?  8 hours ( but is uncomfortable)    How long can you walk comfortably?  8 hours ( but is uncomfortable)    Diagnostic tests  11/09/2019 IMPRESSION:Negative exam    Patient Stated Goals  to decrease pain, and improve function    Currently in Pain?  Yes    Pain Score  2     Pain Location  Hip    Pain Orientation  Left    Pain Descriptors / Indicators  Aching    Pain Type  Chronic pain    Pain Onset  More than a month ago    Pain Frequency  Intermittent    Aggravating Factors   standing and walking    Pain Relieving Factors  stretching                                PT Education - 12/20/19 1141    Education Details  HEP and symptom mangement    Person(s) Educated  Patient    Methods  Explanation;Demonstration;Verbal cues;Tactile cues    Comprehension  Verbalized understanding;Returned demonstration;Verbal cues required;Tactile cues required       PT Short Term Goals - 12/20/19 1332      PT SHORT TERM GOAL #1   Title  pt to be I with intial HEP    Baseline  perfroming all exercises at home    Time  3    Period  Weeks    Status  Achieved    Target Date  12/07/19      PT SHORT TERM GOAL #2   Title  pt to verbalize / demo proper posture and lifting mechanics to reduce and prevent low back pain    Time  3    Period  Weeks    Status  Achieved    Target Date  12/07/19  PT Long Term Goals - 12/20/19 1336      PT LONG TERM GOAL #1   Title  increase trunk extension and bil sidebending by >/= 10 degrees to promote functional ROM required for ADLS    Baseline  full isdebending without pain    Time  5    Period  Weeks    Status  Achieved      PT LONG TERM GOAL #2   Title  pt to be able to walk/ stand for >/= 5-6 hours with </= 1/10 pain for functional endurance required for work related activities    Time  5    Period  Weeks    Status  Achieved      PT LONG TERM GOAL #3   Title  pt to increase bil hip strength to >/= 4+/5 to promote functional lifting mechanics    Time  5    Period  Weeks    Status  Achieved      PT LONG TERM GOAL #4   Title  increase FOTO score to </=40% limited to demo improvement in function    Baseline  36% limited    Time  5    Period  Weeks    Status  Achieved    Target Date  12/20/19      PT LONG TERM GOAL #5   Title  pt to be I with all HEP given as of last visit to maintain and progress current level of function    Time  5    Period  Weeks    Status  Achieved            Plan - 12/20/19 1209    Clinical Impression Statement  Patient  continues to have minor pain but she is doingmuch better. She feels comofrtable with her exercise program. Therapy reviewed her entire program and how to use it. Her FOTOscore has improved significantly. She is no longer having lower back pain.    Personal Factors and Comorbidities  Social Background    Examination-Activity Limitations  Bend;Stand;Locomotion Level    Stability/Clinical Decision Making  Evolving/Moderate complexity    Clinical Decision Making  Moderate    Rehab Potential  Good    PT Frequency  2x / week    PT Duration  4 weeks    PT Treatment/Interventions  ADLs/Self Care Home Management;Electrical Stimulation;Iontophoresis '4mg'$ /ml Dexamethasone;Cryotherapy;Moist Heat;Traction;Ultrasound;Gait training;Therapeutic activities;Therapeutic exercise;Balance training;Neuromuscular re-education;Manual techniques;Dry needling;Passive range of motion;Taping;Patient/family education;Spinal Manipulations    PT Next Visit Plan  review/ update HEP PRN, potential SIJ ant rotation deficit: hamstring stretching, hip flexor strengthening, core strength, STW, discuss DN?, posture/ lifting mechanics    PT Home Exercise Plan  [id number] - lower trunk rotation, supine piriformis stretch, hamstring stretching (supine and seated), SLR, self MET for R hip flexor, bridge, sidelying hip abduction, supine 90-90 alternating toe taps    Consulted and Agree with Plan of Care  Patient       Patient will benefit from skilled therapeutic intervention in order to improve the following deficits and impairments:  Improper body mechanics, Increased muscle spasms, Decreased strength, Postural dysfunction, Pain, Decreased activity tolerance, Decreased endurance, Decreased range of motion  Visit Diagnosis: Chronic right-sided low back pain without sciatica  Muscle spasm of back  Muscle weakness (generalized)    PHYSICAL THERAPY DISCHARGE SUMMARY  Visits from Start of Care: 4  Current functional level related to  goals / functional outcomes: Improved ability to work    Remaining deficits: Mild  pain in the left hip    Education / Equipment: HEP   Plan: Patient agrees to discharge.  Patient goals were met. Patient is being discharged due to meeting the stated rehab goals.  ?????      Problem List Patient Active Problem List   Diagnosis Date Noted  . Elevated blood pressure reading 02/21/2019  . Joint pain 02/21/2019  . Preventative health care 02/21/2019  . KNEE PAIN, RIGHT 07/04/2008  . SPRAIN AND STRAIN OF SACROILIAC 07/04/2008    Carney Living PT DPT  12/20/2019, 3:29 PM  [facility] [address] [place], [place], [address] Phone: [phone]   Fax:  [phone]  Name: Areona Homer MRN: [id number] Date of Birth: [birth year]

## 2019-12-22 ENCOUNTER — Ambulatory Visit: Payer: PRIVATE HEALTH INSURANCE | Admitting: Physical Therapy

## 2020-11-07 ENCOUNTER — Encounter: Payer: 59 | Admitting: Internal Medicine

## 2020-11-15 DIAGNOSIS — Z20822 Contact with and (suspected) exposure to covid-19: Secondary | ICD-10-CM | POA: Diagnosis not present

## 2021-01-09 DIAGNOSIS — H5203 Hypermetropia, bilateral: Secondary | ICD-10-CM | POA: Diagnosis not present

## 2021-03-03 ENCOUNTER — Emergency Department (HOSPITAL_COMMUNITY): Payer: Self-pay

## 2021-03-03 ENCOUNTER — Encounter (HOSPITAL_COMMUNITY): Payer: Self-pay

## 2021-03-03 ENCOUNTER — Emergency Department (HOSPITAL_COMMUNITY)
Admission: EM | Admit: 2021-03-03 | Discharge: 2021-03-04 | Disposition: A | Discharge status: Home / Self Care | Payer: Self-pay | Attending: Emergency Medicine | Admitting: Emergency Medicine

## 2021-03-03 ENCOUNTER — Other Ambulatory Visit: Payer: Self-pay

## 2021-03-03 DIAGNOSIS — M79672 Pain in left foot: Secondary | ICD-10-CM

## 2021-03-03 DIAGNOSIS — W19XXXA Unspecified fall, initial encounter: Secondary | ICD-10-CM

## 2021-03-03 DIAGNOSIS — R202 Paresthesia of skin: Secondary | ICD-10-CM | POA: Insufficient documentation

## 2021-03-03 DIAGNOSIS — W01198A Fall on same level from slipping, tripping and stumbling with subsequent striking against other object, initial encounter: Secondary | ICD-10-CM | POA: Insufficient documentation

## 2021-03-03 DIAGNOSIS — M25552 Pain in left hip: Secondary | ICD-10-CM | POA: Insufficient documentation

## 2021-03-03 DIAGNOSIS — M25572 Pain in left ankle and joints of left foot: Secondary | ICD-10-CM | POA: Insufficient documentation

## 2021-03-03 DIAGNOSIS — Y92512 Supermarket, store or market as the place of occurrence of the external cause: Secondary | ICD-10-CM | POA: Insufficient documentation

## 2021-03-03 MED ORDER — HYDROCODONE-ACETAMINOPHEN 5-325 MG PO TABS
1.0000 | ORAL_TABLET | Freq: Once | ORAL | Status: DC
  Filled 2021-03-03: qty 1

## 2021-03-03 MED ORDER — ACETAMINOPHEN 500 MG PO TABS
1000.0000 mg | ORAL_TABLET | Freq: Once | ORAL | Status: AC
  Administered 2021-03-03: 500 mg via ORAL
  Filled 2021-03-03: qty 2

## 2021-03-03 MED ORDER — ONDANSETRON 4 MG PO TBDP
4.0000 mg | ORAL_TABLET | Freq: Once | ORAL | Status: DC
  Filled 2021-03-03: qty 1

## 2021-03-03 NOTE — ED Provider Notes (Signed)
Mechanical fall tonight Back, hip, knee and foot Pending CT foot for ?ligamentous injury  Anticipate d/ch, ortho f/u  CT foot negative for abnormality. Discussed results with the patient. Will provide boot and crutches for comfort. Ortho follow up as needed.    Elpidio Anis, PA-C 03/04/21 0122    Tegeler, Canary Brim, MD 03/05/21 [phone]

## 2021-03-03 NOTE — ED Triage Notes (Signed)
Pt BIB EMS from grocery store. Pt slipped on some water and landed on left knee. Pt endorses left knee pain. Pt denies hitting her head, LOC, and blood thinners.

## 2021-03-03 NOTE — ED Provider Notes (Signed)
[facility]-EMERGENCY DEPT Provider Note   CSN: [id number] Arrival date & time: 03/03/21  1948     History Chief Complaint  Patient presents with   Kathryn Lester    Kathryn Lester is a 60 y.o. female who presents via EMS for evaluation of fall.  Patient reports she was at the grocery store and she went to the cashier to go pain and states that she slipped on some water.  She reports that she fell and hit her left knee.  She also thinks she may have twisted her left ankle and left foot.  She states she did not hit her head or lose consciousness.  She has not tried to get up and ambulate since this happened.  She reports that she feels like when she presses down on her left foot, it hurts more.  She has some tingling sensation but otherwise can feel me touching her.  She is also had some hip pain since being here.  She denies any neck pain, back pain.  The history is provided by the patient.      Past Medical History:  Diagnosis Date   Depression    self reported    Patient Active Problem List   Diagnosis Date Noted   Elevated blood pressure reading 02/21/2019   Joint pain 02/21/2019   Preventative health care 02/21/2019   KNEE PAIN, RIGHT 07/04/2008   SPRAIN AND STRAIN OF SACROILIAC 07/04/2008    Past Surgical History:  Procedure Laterality Date   TUBAL LIGATION  1995     OB History   No obstetric history on file.     Family History  Problem Relation Age of Onset   Hypertension Mother     Social History   Tobacco Use   Smoking status: Never   Smokeless tobacco: Never  Substance Use Topics   Alcohol use: No   Drug use: No    Home Medications Prior to Admission medications   Medication Sig Start Date End Date Taking? Authorizing Provider  ibuprofen (ADVIL) 200 MG tablet Take 200 mg by mouth every 6 (six) hours as needed.    [provider]  Melatonin 10 MG TABS Take 10 mg by mouth.    [provider]    Allergies     Patient has no known allergies.  Review of Systems   Review of Systems  Musculoskeletal:  Negative for back pain and neck pain.       Left foot pain Left knee pain Left hip pain  Neurological:  Negative for weakness and numbness.  All other systems reviewed and are negative.  Physical Exam Updated Vital Signs BP 101/75   Pulse 66   Temp 98.8 F (37.1 C) (Oral)   Resp 18   LMP 12/08/2011   SpO2 100%   Physical Exam Vitals and nursing note reviewed.  Constitutional:      Appearance: She is well-developed.  HENT:     Head: Normocephalic and atraumatic.     Comments: No tenderness to palpation of skull. No deformities or crepitus noted. No open wounds, abrasions or lacerations.  Eyes:     General: No scleral icterus.       Right eye: No discharge.        Left eye: No discharge.     Conjunctiva/sclera: Conjunctivae normal.  Neck:     Comments: Full flexion/extension and lateral movement of neck fully intact. No bony midline tenderness. No deformities or crepitus.  Cardiovascular:  Pulses:          Dorsalis pedis pulses are 2+ on the right side and 2+ on the left side.  Pulmonary:     Effort: Pulmonary effort is normal.  Musculoskeletal:     Comments: Tenderness palpation of the medial aspect of the left foot with some mild soft tissue swelling.  No deformity or crepitus noted.  She can wiggle her toes.  This tenderness extends slightly to the medial malleolus.  No deformity or crepitus noted.  No overlying warmth, erythema, edema.  No bony tenderness noted to the tib-fib.  She does have some tenderness noted to the medial aspect of the left knee.  No deformity or crepitus noted.  Flexion/extension intact though she does report some pain with flexion.  Tenderness palpation noted to the left hip.  Limited range of motion secondary to pain.  No deformity or crepitus noted.  No midline T or L-spine tenderness.  No bony tenderness noted to right lower extremity.  Skin:     General: Skin is warm and dry.  Neurological:     Mental Status: She is alert.  Psychiatric:        Speech: Speech normal.        Behavior: Behavior normal.    ED Results / Procedures / Treatments   Labs (all labs ordered are listed, but only abnormal results are displayed) Labs Reviewed - No data to display  EKG None  Radiology DG Lumbar Spine Complete  Result Date: 03/03/2021 CLINICAL DATA:  60 year old female with back pain. EXAM: LUMBAR SPINE - COMPLETE 4+ VIEW COMPARISON:  Lumbar spine radiograph dated 11/09/2019. FINDINGS: Five lumbar type vertebra. There is no acute fracture or subluxation of the lumbar spine. The visualized posterior elements appear intact. Mild multilevel degenerative changes and spurring. The neural foramina appear patent as visualized. The soft tissues are unremarkable. Bilateral tubal ligation clips noted. IMPRESSION: Negative. Electronically Signed   By: Elgie Collard M.D.   On: 03/03/2021 22:28   DG Ankle Complete Left  Result Date: 03/03/2021 CLINICAL DATA:  Recent slip and fall with ankle pain, initial encounter EXAM: LEFT ANKLE COMPLETE - 3+ VIEW COMPARISON:  None. FINDINGS: There is no evidence of fracture, dislocation, or joint effusion. There is no evidence of arthropathy or other focal bone abnormality. Soft tissues are unremarkable. IMPRESSION: No acute abnormality noted. Electronically Signed   By: Alcide Clever M.D.   On: 03/03/2021 21:31   DG Knee Complete 4 Views Left  Result Date: 03/03/2021 CLINICAL DATA:  Recent slip and fall with knee pain, initial encounter EXAM: LEFT KNEE - COMPLETE 4+ VIEW COMPARISON:  None. FINDINGS: No evidence of fracture, dislocation, or joint effusion. No evidence of arthropathy or other focal bone abnormality. Soft tissues are unremarkable. IMPRESSION: No acute abnormality noted. Electronically Signed   By: Alcide Clever M.D.   On: 03/03/2021 21:22   DG Foot Complete Left  Result Date: 03/03/2021 CLINICAL DATA:   Recent slip and fall with left foot pain, initial encounter EXAM: LEFT FOOT - COMPLETE 3+ VIEW COMPARISON:  None. FINDINGS: No acute fracture or dislocation is noted. Slight widening between the os naviculare and the navicular bone is noted which could be related to recent injury. No other focal abnormality is noted. IMPRESSION: No definitive fracture seen. Slight widening between the os naviculare and the navicular bone as described. This could be related to the recent injury and site for pain. Electronically Signed   By: Eulah Pont.D.  On: 03/03/2021 21:30   DG Hip Unilat W or Wo Pelvis 2-3 Views Left  Result Date: 03/03/2021 CLINICAL DATA:  Initial evaluation for acute left hip pain status post fall. EXAM: DG HIP (WITH OR WITHOUT PELVIS) 2-3V LEFT COMPARISON:  None. FINDINGS: There is no evidence of hip fracture or dislocation. There is no evidence of arthropathy or other focal bone abnormality. IMPRESSION: Negative.  No acute osseous abnormality about the left hip. Electronically Signed   By: Rise Mu M.D.   On: 03/03/2021 22:25    Procedures Procedures   Medications Ordered in ED Medications  acetaminophen (TYLENOL) tablet 1,000 mg (500 mg Oral Given 03/03/21 2226)    ED Course  I have reviewed the triage vital signs and the nursing notes.  Pertinent labs & imaging results that were available during my care of the patient were reviewed by me and considered in my medical decision making (see chart for details).    MDM Rules/Calculators/A&P                          60 year old female who presents for evaluation of left foot, left knee, left hip pain after mechanical fall that occurred earlier this afternoon.  She reports she was in the grocery store and states that there was some water on the floor and she slipped and fell.  Does not think she hit her head or had any LOC.  She is on a blood thinners.  On initial arrival, she is afebrile, toxic appearing.  Vital signs are  stable.  On exam, she has pain noted to the medial aspect of her left foot as well into the   Medial malleolus.  She also has pain noted to the medial aspect of her left knee.  Limited range of motion secondary to pain.  Patient also with tenderness into left hip.  Concern for contusion versus sprain versus fracture.  We will plan for x-ray imaging.  X-ray of knee negative for any acute bony abnormalities.  Foot x-ray shows slight widening between the os navicular and the neck vascular bone as described.  No other focal abnormalities noted.  Ankle x-ray is negative.  Hip x-ray is negative.  Lumbar x-ray is negative.  She is tender at that area so we will evaluate it with CT imaging.   Updated patient on plan with Spanish interpreter.  Patient signed out to Elpidio Anis, PA-C with CT foot contrast.   Portions of this note were generated with Dragon dictation software. Dictation errors may occur despite best attempts at proofreading.   Final Clinical Impression(s) / ED Diagnoses Final diagnoses:  Fall, initial encounter  Left foot pain    Rx / DC Orders ED Discharge Orders     None        Rosana Hoes 03/03/21 2356    Tegeler, Canary Brim, MD 03/04/21 0001

## 2021-03-04 MED ORDER — IBUPROFEN 600 MG PO TABS: 600.0000 mg | ORAL_TABLET | Freq: Four times a day (QID) | ORAL | 0 refills | Status: AC | PRN

## 2021-03-04 NOTE — Discharge Instructions (Addendum)
Follow up with Dr. Ave Filter if pain is no better in 4-5 days. Use the boot and crutches to ambulate safely. [facility] un seguimiento con el Dr. Ave Filter si el dolor no mejora en 4 o 5 das. Use la bota y las muletas para deambular con seguridad.)

## 2022-06-19 ENCOUNTER — Emergency Department (HOSPITAL_COMMUNITY)
Admission: EM | Admit: 2022-06-19 | Discharge: 2022-06-20 | Disposition: A | Discharge status: Home / Self Care | Payer: Commercial Managed Care - HMO | Attending: Emergency Medicine | Admitting: Emergency Medicine

## 2022-06-19 ENCOUNTER — Other Ambulatory Visit: Payer: Self-pay

## 2022-06-19 ENCOUNTER — Encounter (HOSPITAL_COMMUNITY): Payer: Self-pay | Admitting: Emergency Medicine

## 2022-06-19 ENCOUNTER — Emergency Department (HOSPITAL_COMMUNITY): Payer: Commercial Managed Care - HMO

## 2022-06-19 DIAGNOSIS — R42 Dizziness and giddiness: Secondary | ICD-10-CM | POA: Diagnosis present

## 2022-06-19 DIAGNOSIS — D72829 Elevated white blood cell count, unspecified: Secondary | ICD-10-CM | POA: Insufficient documentation

## 2022-06-19 DIAGNOSIS — R110 Nausea: Secondary | ICD-10-CM | POA: Diagnosis not present

## 2022-06-19 LAB — CBC WITH DIFFERENTIAL/PLATELET
Abs Immature Granulocytes: 0.06 10*3/uL (ref 0.00–0.07)
Basophils Absolute: 0.1 10*3/uL (ref 0.0–0.1)
Basophils Relative: 1 %
Eosinophils Absolute: 0 10*3/uL (ref 0.0–0.5)
Eosinophils Relative: 0 %
HCT: 46.1 % — ABNORMAL HIGH (ref 36.0–46.0)
Hemoglobin: 15.6 g/dL — ABNORMAL HIGH (ref 12.0–15.0)
Immature Granulocytes: 1 %
Lymphocytes Relative: 10 %
Lymphs Abs: 1.4 10*3/uL (ref 0.7–4.0)
MCH: 30.8 pg (ref 26.0–34.0)
MCHC: 33.8 g/dL (ref 30.0–36.0)
MCV: 90.9 fL (ref 80.0–100.0)
Monocytes Absolute: 0.6 10*3/uL (ref 0.1–1.0)
Monocytes Relative: 5 %
Neutro Abs: 11.1 10*3/uL — ABNORMAL HIGH (ref 1.7–7.7)
Neutrophils Relative %: 83 %
Platelets: 245 10*3/uL (ref 150–400)
RBC: 5.07 MIL/uL (ref 3.87–5.11)
RDW: 13.1 % (ref 11.5–15.5)
WBC: 13.2 10*3/uL — ABNORMAL HIGH (ref 4.0–10.5)
nRBC: 0 % (ref 0.0–0.2)

## 2022-06-19 LAB — BASIC METABOLIC PANEL
Anion gap: 14 (ref 5–15)
BUN: 12 mg/dL (ref 8–23)
CO2: 24 mmol/L (ref 22–32)
Calcium: 9.4 mg/dL (ref 8.9–10.3)
Chloride: 104 mmol/L (ref 98–111)
Creatinine, Ser: 0.73 mg/dL (ref 0.44–1.00)
GFR, Estimated: >60 mL/min (ref >60)
Glucose, Bld: 108 mg/dL — ABNORMAL HIGH (ref 70–99)
Potassium: 4.1 mmol/L (ref 3.5–5.1)
Sodium: 142 mmol/L (ref 135–145)

## 2022-06-19 NOTE — ED Provider Triage Note (Signed)
Emergency Medicine Provider Triage Evaluation Note  Kathryn Lester , a 61 y.o. female  was evaluated in triage.  Pt complains of dizziness.  Is been intermittent for the last few days.  Feels like the room is spinning, worse with positions and with moving her neck.  Denies any loss of vision, double vision or weakness or numbness.  No history of the same.  States she has been having remittent nausea and vomiting as well..  Review of Systems  Per HPI  Physical Exam  BP (!) 145/78 (BP Location: Right Arm)   Pulse 67   Temp 98.6 F (37 C)   Resp 15   LMP 12/08/2011   SpO2 100%  Gen:   Awake, no distress   Resp:  Normal effort  MSK:   Moves extremities without difficulty  Other:  Close commands, no dysarthria.  No nystagmus on exam.  Cranial 2-12 grossly intact.  Upper and lower extremity strength is symmetric  Medical Decision Making  Medically screening exam initiated at 5:55 PM.  Appropriate orders placed.  Kathryn Lester was informed that the remainder of the evaluation will be completed by another provider, this initial triage assessment does not replace that evaluation, and the importance of remaining in the ED until their evaluation is complete.     Sherrill Raring, PA-C 06/19/22 1756

## 2022-06-19 NOTE — ED Triage Notes (Signed)
Pt reports dizziness "like the room is spinning"  and N/V that started on Sunday. Denies pain at this time.

## 2022-06-20 MED ORDER — ONDANSETRON HCL 4 MG PO TABS: 4.0000 mg | ORAL_TABLET | Freq: Four times a day (QID) | ORAL | 0 refills | Status: AC

## 2022-06-20 MED ORDER — MECLIZINE HCL 25 MG PO TABS: 25.0000 mg | ORAL_TABLET | Freq: Three times a day (TID) | ORAL | 0 refills | Status: AC | PRN

## 2022-06-20 NOTE — ED Provider Notes (Signed)
[facility] EMERGENCY DEPARTMENT Provider Note   CSN: [id number] Arrival date & time: 06/19/22  1557     History  Chief Complaint  Patient presents with   Dizziness    Kathryn Lester is a 61 y.o. female who presents to the emergency room with complaint of intermittent dizziness for the past few days.  Patient states she feels like the room is spinning.  Symptoms are worse when she turns her head or lies down.  Dizziness is worst when turning head to the left.  She also endorses nausea with episodes of dizziness.  Denies headache, lightheadedness, syncope, pain, shortness of breath, palpitations, fever, blurred vision, double vision, or loss of vision.       Home Medications Prior to Admission medications   Medication Sig Start Date End Date Taking? Authorizing Provider  meclizine (ANTIVERT) 25 MG tablet Take 1 tablet (25 mg total) by mouth 3 (three) times daily as needed for dizziness. 06/20/22  Yes Lugene Hitt R, PA  ondansetron (ZOFRAN) 4 MG tablet Take 1 tablet (4 mg total) by mouth every 6 (six) hours. 06/20/22  Yes Kavi Almquist R, PA  ibuprofen (ADVIL) 600 MG tablet Take 1 tablet (600 mg total) by mouth every 6 (six) hours as needed. 03/04/21   Charlann Lange, PA-C  Melatonin 10 MG TABS Take 10 mg by mouth.    [provider]      Allergies    Patient has no known allergies.    Review of Systems   Review of Systems  Constitutional:  Negative for fever.  Eyes:  Negative for visual disturbance.  Respiratory:  Negative for shortness of breath.   Cardiovascular:  Negative for chest pain and palpitations.  Neurological:  Positive for dizziness. Negative for syncope, weakness, light-headedness and headaches.    Physical Exam Updated Vital Signs BP (!) 151/99 (BP Location: Left Arm)   Pulse (!) 102   Temp 98.2 F (36.8 C)   Resp 14   LMP 12/08/2011   SpO2 98%  Physical Exam Vitals and nursing note reviewed.  Constitutional:       General: She is not in acute distress.    Appearance: Normal appearance. She is not ill-appearing or diaphoretic.  Eyes:     General: No visual field deficit.    Extraocular Movements: Extraocular movements intact.     Pupils: Pupils are equal, round, and reactive to light.  Cardiovascular:     Rate and Rhythm: Normal rate and regular rhythm.     Heart sounds: Normal heart sounds.  Pulmonary:     Effort: Pulmonary effort is normal.     Breath sounds: Normal breath sounds and air entry.  Skin:    General: Skin is warm and dry.     Capillary Refill: Capillary refill takes less than 2 seconds.  Neurological:     General: No focal deficit present.     Mental Status: She is alert and oriented to person, place, and time. Mental status is at baseline.     Cranial Nerves: Cranial nerves 2-12 are intact.     Sensory: Sensation is intact.     Motor: Motor function is intact.     Coordination: Coordination is intact.     Gait: Gait is intact.  Psychiatric:        Attention and Perception: Attention normal.        Mood and Affect: Mood normal.        Behavior: Behavior normal.  Cognition and Memory: Cognition normal.     ED Results / Procedures / Treatments   Labs (all labs ordered are listed, but only abnormal results are displayed) Labs Reviewed  CBC WITH DIFFERENTIAL/PLATELET - Abnormal; Notable for the following components:      Result Value   WBC 13.2 (*)    Hemoglobin 15.6 (*)    HCT 46.1 (*)    Neutro Abs 11.1 (*)    All other components within normal limits  BASIC METABOLIC PANEL - Abnormal; Notable for the following components:   Glucose, Bld 108 (*)    All other components within normal limits    EKG EKG Interpretation  Date/Time:  Thursday June 19 2022 17:08:39 EDT Ventricular Rate:  77 PR Interval:  140 QRS Duration: 86 QT Interval:  380 QTC Calculation: 430 R Axis:   20 Text Interpretation: Normal sinus rhythm Normal ECG When compared with ECG of  22-Jun-2015 11:53, No significant change was found Confirmed by Delora Fuel ([id number]) on 06/20/2022 12:30:11 AM  Radiology CT Head Wo Contrast  Result Date: 06/19/2022 CLINICAL DATA:  Dizziness, intermittent nausea and vomiting EXAM: CT HEAD WITHOUT CONTRAST TECHNIQUE: Contiguous axial images were obtained from the base of the skull through the vertex without intravenous contrast. RADIATION DOSE REDUCTION: This exam was performed according to the departmental dose-optimization program which includes automated exposure control, adjustment of the mA and/or kV according to patient size and/or use of iterative reconstruction technique. COMPARISON:  None Available. FINDINGS: Brain: No acute infarct or hemorrhage. Lateral ventricles and midline structures are unremarkable. No acute extra-axial fluid collections. No mass effect. Vascular: No hyperdense vessel or unexpected calcification. Skull: Normal. Negative for fracture or focal lesion. Sinuses/Orbits: No acute finding. Other: None. IMPRESSION: 1. No acute intracranial process. Electronically Signed   By: Randa Ngo M.D.   On: 06/19/2022 20:07    Procedures Procedures    Medications Ordered in ED Medications - No data to display  ED Course/ Medical Decision Making/ A&P                           Medical Decision Making Problems Addressed: Vertigo: acute illness or injury  Amount and/or Complexity of Data Reviewed Labs: ordered. Decision-making details documented in ED Course. Radiology: ordered and independent interpretation performed. Decision-making details documented in ED Course.  Risk Prescription drug management.   This patient presents to the ED with chief complaint(s) of dizziness and nausea for the past few days with pertinent past medical history of no reported medical problems.The complaint involves an extensive differential diagnosis and also carries with it a high risk of complications and morbidity.    The differential  diagnosis includes BPPV, Mnire's disease, CVA, anemia hypoglycemia, dysrhythmia.  The initial plan is to order CT head, CBC, BMP.  Independent labs interpretation:  The following labs were independently interpreted: CBC significant for leukocytosis, WBC 13.2 and elevated hemoglobin 15.6.  Patient is a e-cigarette user daily, has no fevers.  BMP reveals no metabolic disturbance, slightly elevated glucose at 108.  Independent visualization and interpretation of imaging: - I independently visualized the following imaging with scope of interpretation limited to determining acute life threatening conditions related to emergency care: CT head, which revealed no acute intracranial abnormality, no acute infarct, no hemorrhage, normal ventricles.  ECG obtained and independently interpreted, reveals normal sinus rhythm without evidence of arrhythmia, ectopy, ischemia, or infarction.  Treatment and Reassessment: She has normal neuro exam, no focal deficit,  CN II through XII grossly intact.  EOM intact without nystagmus.  Equal strength bilaterally for upper and lower extremities.  She does experience dizziness when turning head to the left.  Heart rate and rhythm regular with normal heart sounds.  LCTAB.  Based on physical exam, patient presentation, and work-up, I suspect vertigo at this time.  Plan to prescribe meclizine for vertigo treatment and Zofran for nausea.  Will refer patient to neurology for hospital follow-up.  Discussed work-up and plan for discharge with patient and she agrees with discharge.  Return precautions given.  Discussed HPI, physical exam, work-up, assessment and plan with attending Carmin Muskrat who agrees with plan for discharge and neurology follow-up.         Final Clinical Impression(s) / ED Diagnoses Final diagnoses:  Vertigo    Rx / DC Orders ED Discharge Orders          Ordered    ondansetron (ZOFRAN) 4 MG tablet  Every 6 hours        06/20/22 1101     meclizine (ANTIVERT) 25 MG tablet  3 times daily PRN        06/20/22 1101    Ambulatory referral to Neurology       Comments: An appointment is requested in approximately: 1 week   06/20/22 1102              Carlis Abbott [place], [place] 06/20/22 1116    Carmin Muskrat, MD 06/20/22 1651

## 2022-06-20 NOTE — Discharge Instructions (Addendum)
Lo atendieron en la sala de emergencias por mareos. Su tomografa computarizada y [place] son tranquilizadores y es probable que tenga vrtigo. Te he remitido a un neurlogo para seguimiento. Le he recetado meclizina para los [employer] y zofran para las nuseas.  Regrese a la sala de emergencias si presenta sntomas nuevos o que empeoran.

## 2022-07-10 NOTE — Progress Notes (Deleted)
[facility]  PATIENT: Kathryn Lester DOB: [birth year]  REFERRING CLINICIAN: Melton Alar R, PA HISTORY FROM: *** REASON FOR VISIT: vertigo   HISTORICAL  CHIEF COMPLAINT:  No chief complaint on file.   HISTORY OF PRESENT ILLNESS:  The patient presents for evaluation of vertigo which began last month. This is described as a sensation of room spinning which is worse when she turns her head or lies down*** She presented to the ED 06/19/22 where [facility] was normal. She was prescribed meclizine and Zofran***  OTHER MEDICAL CONDITIONS: ***   REVIEW OF SYSTEMS: Full 14 system review of systems performed and negative with exception of: ***  ALLERGIES: No Known Allergies  HOME MEDICATIONS: Outpatient Medications Prior to Visit  Medication Sig Dispense Refill   ibuprofen (ADVIL) 600 MG tablet Take 1 tablet (600 mg total) by mouth every 6 (six) hours as needed. 30 tablet 0   meclizine (ANTIVERT) 25 MG tablet Take 1 tablet (25 mg total) by mouth 3 (three) times daily as needed for dizziness. 30 tablet 0   Melatonin 10 MG TABS Take 10 mg by mouth.     ondansetron (ZOFRAN) 4 MG tablet Take 1 tablet (4 mg total) by mouth every 6 (six) hours. 12 tablet 0   No facility-administered medications prior to visit.    PAST MEDICAL HISTORY: Past Medical History:  Diagnosis Date   Depression    self reported    PAST SURGICAL HISTORY: Past Surgical History:  Procedure Laterality Date   TUBAL LIGATION  1995    FAMILY HISTORY: Family History  Problem Relation Age of Onset   Hypertension Mother     SOCIAL HISTORY: Social History   Socioeconomic History   Marital status: Married    Spouse name: Not on file   Number of children: Not on file   Years of education: Not on file   Highest education level: Not on file  Occupational History   Not on file  Tobacco Use   Smoking status: Never   Smokeless tobacco: Never  Substance and Sexual Activity   Alcohol  use: No   Drug use: No   Sexual activity: Yes    Birth control/protection: Condom  Other Topics Concern   Not on file  Social History Narrative   Not on file   Social Determinants of Health   Financial Resource Strain: Not on file  Food Insecurity: Not on file  Transportation Needs: Not on file  Physical Activity: Not on file  Stress: Not on file  Social Connections: Not on file  Intimate Partner Violence: Not on file     PHYSICAL EXAM ***  GENERAL EXAM/CONSTITUTIONAL: Vitals: There were no vitals filed for this visit. There is no height or weight on file to calculate BMI. Wt Readings from Last 3 Encounters:  02/21/19 149 lb 8 oz (67.8 kg)  10/01/17 141 lb (64 kg)  03/17/17 142 lb (64.4 kg)   Patient is in no distress; well developed, nourished and groomed; neck is supple  CARDIOVASCULAR: Examination of carotid arteries is normal; no carotid bruits Regular rate and rhythm, no murmurs Examination of peripheral vascular system by observation and palpation is normal  EYES: Pupils round and reactive to light, Visual fields full to confrontation, Extraocular movements intacts,   MUSCULOSKELETAL: Gait, strength, tone, movements noted in Neurologic exam below  NEUROLOGIC: MENTAL STATUS:      No data to display         awake, alert, oriented to person, place and  time recent and remote memory intact normal attention and concentration language fluent, comprehension intact, naming intact fund of knowledge appropriate  CRANIAL NERVE:  2nd - no papilledema or hemorrhages on fundoscopic exam 2nd, 3rd, 4th, 6th - pupils equal and reactive to light, visual fields full to confrontation, extraocular muscles intact, no nystagmus 5th - facial sensation symmetric 7th - facial strength symmetric 8th - hearing intact 9th - palate elevates symmetrically, uvula midline 11th - shoulder shrug symmetric 12th - tongue protrusion midline  MOTOR:  normal bulk and tone, full  strength in the BUE, BLE  SENSORY:  normal and symmetric to light touch, pinprick, temperature, vibration  COORDINATION:  finger-nose-finger, fine finger movements normal  REFLEXES:  deep tendon reflexes present and symmetric  GAIT/STATION:  normal     DIAGNOSTIC DATA (LABS, IMAGING, TESTING) - I reviewed patient records, labs, notes, testing and imaging myself where available.  Lab Results  Component Value Date   WBC 13.2 (H) 06/19/2022   HGB 15.6 (H) 06/19/2022   HCT 46.1 (H) 06/19/2022   MCV 90.9 06/19/2022   PLT 245 06/19/2022      Component Value Date/Time   NA 142 06/19/2022 1801   NA 141 02/21/2019 1404   K 4.1 06/19/2022 1801   CL 104 06/19/2022 1801   CO2 24 06/19/2022 1801   GLUCOSE 108 (H) 06/19/2022 1801   BUN 12 06/19/2022 1801   BUN 15 02/21/2019 1404   CREATININE 0.73 06/19/2022 1801   CREATININE 0.80 05/02/2014 1939   CALCIUM 9.4 06/19/2022 1801   PROT 6.9 05/02/2014 1939   ALBUMIN 4.4 05/02/2014 1939   AST 14 05/02/2014 1939   ALT 17 05/02/2014 1939   ALKPHOS 73 05/02/2014 1939   BILITOT 0.3 05/02/2014 1939   GFRNONAA >60 06/19/2022 1801   GFRAA 77 02/21/2019 1404   Lab Results  Component Value Date   CHOL 179 12/04/2012   HDL 54 12/04/2012   LDLCALC 98 12/04/2012   TRIG 136 12/04/2012   CHOLHDL 3.3 12/04/2012   No results found for: "HGBA1C" No results found for: "VITAMINB12" Lab Results  Component Value Date   TSH 1.950 03/17/2017    ***    ASSESSMENT AND PLAN  61 y.o. year old female with ***   No diagnosis found.    PLAN:   No orders of the defined types were placed in this encounter.   No orders of the defined types were placed in this encounter.   No follow-ups on file.    Ocie Doyne, MD  I spent an average of *** chart reviewing and counseling the patient, with at least 50% of the time face to face with the patient.   [facility] [address], [address] [place], [place]  [address] [phone]

## 2022-07-11 ENCOUNTER — Ambulatory Visit: Payer: Commercial Managed Care - HMO | Admitting: Psychiatry

## 2022-07-11 ENCOUNTER — Encounter: Payer: Self-pay | Admitting: Psychiatry

## 2022-07-31 ENCOUNTER — Ambulatory Visit: Payer: Commercial Managed Care - HMO | Admitting: Neurology

## 2023-06-23 IMAGING — CR DG LUMBAR SPINE COMPLETE 4+V
5 series · 5 of 5 positions shown · non-contrast
Comparison: Lumbar spine radiograph dated 11/09/2019.

CLINICAL DATA: 59-year-old female with back pain.

EXAM:
LUMBAR SPINE - COMPLETE 4+ VIEW

[t lumbar spine ap]
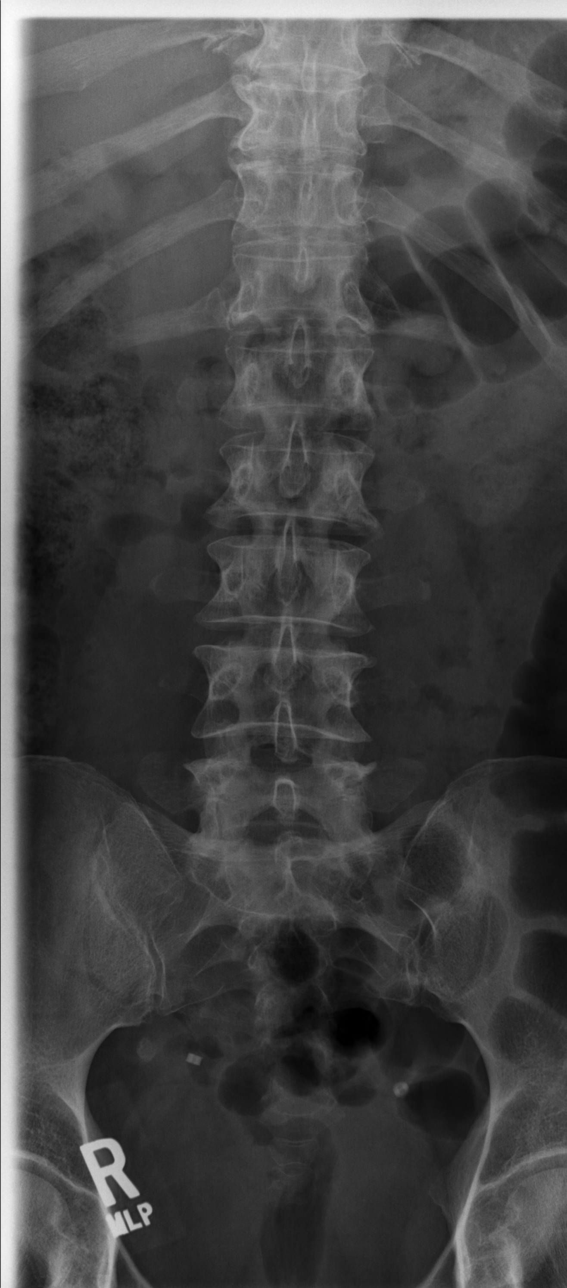

[t lumbar spine obl (1 of 2)]
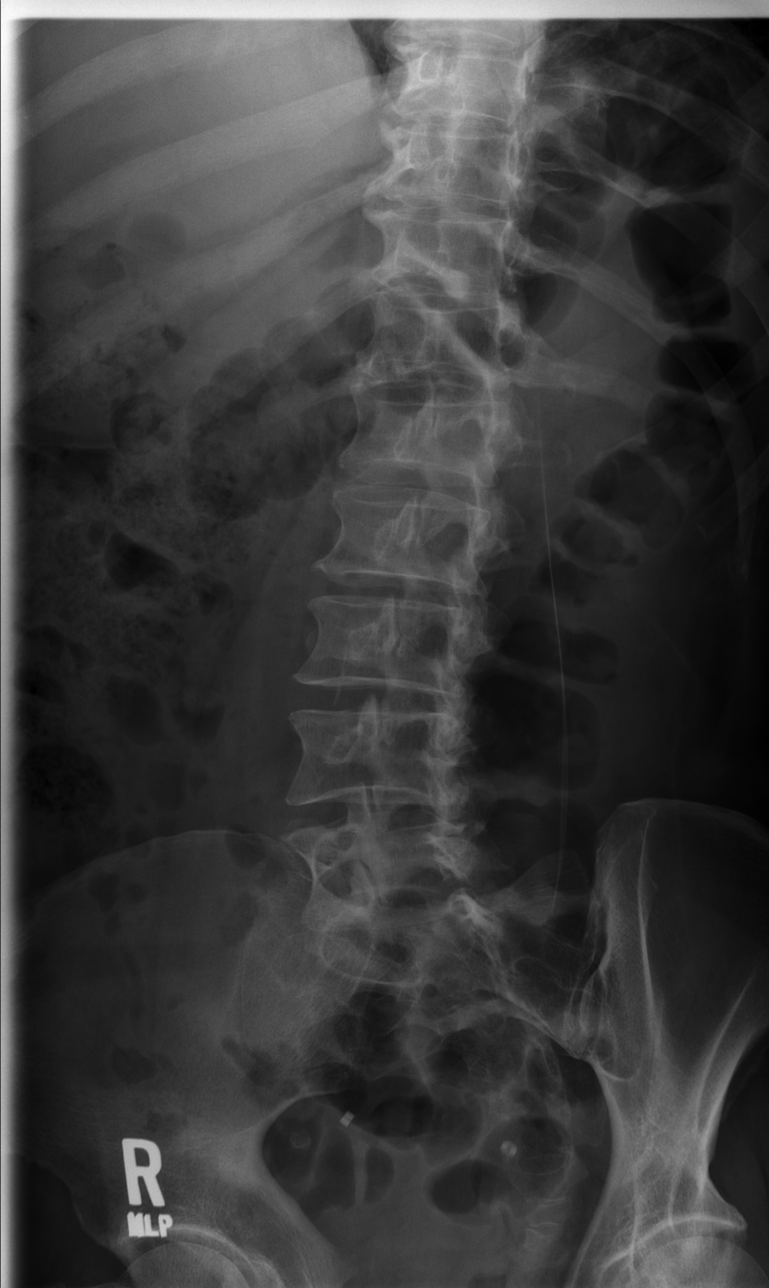

[t lumbar spine obl (2 of 2)]
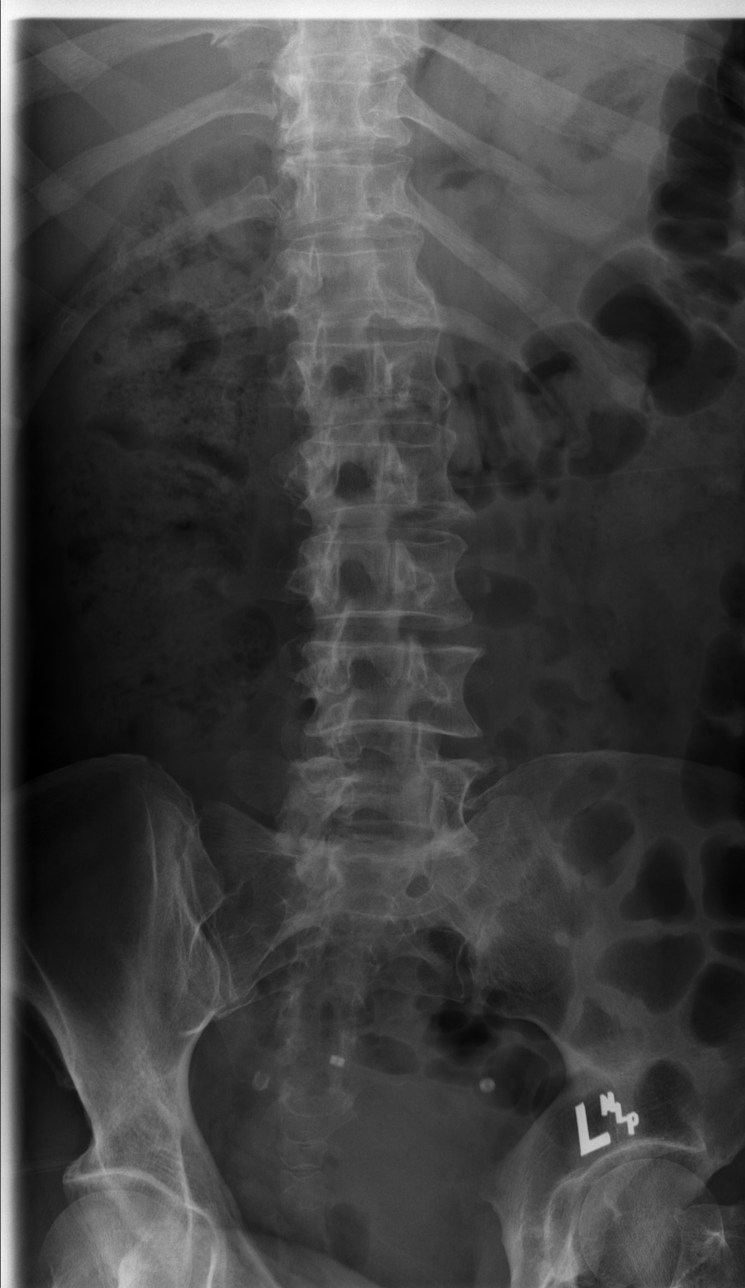

[t lumbar spine lat]
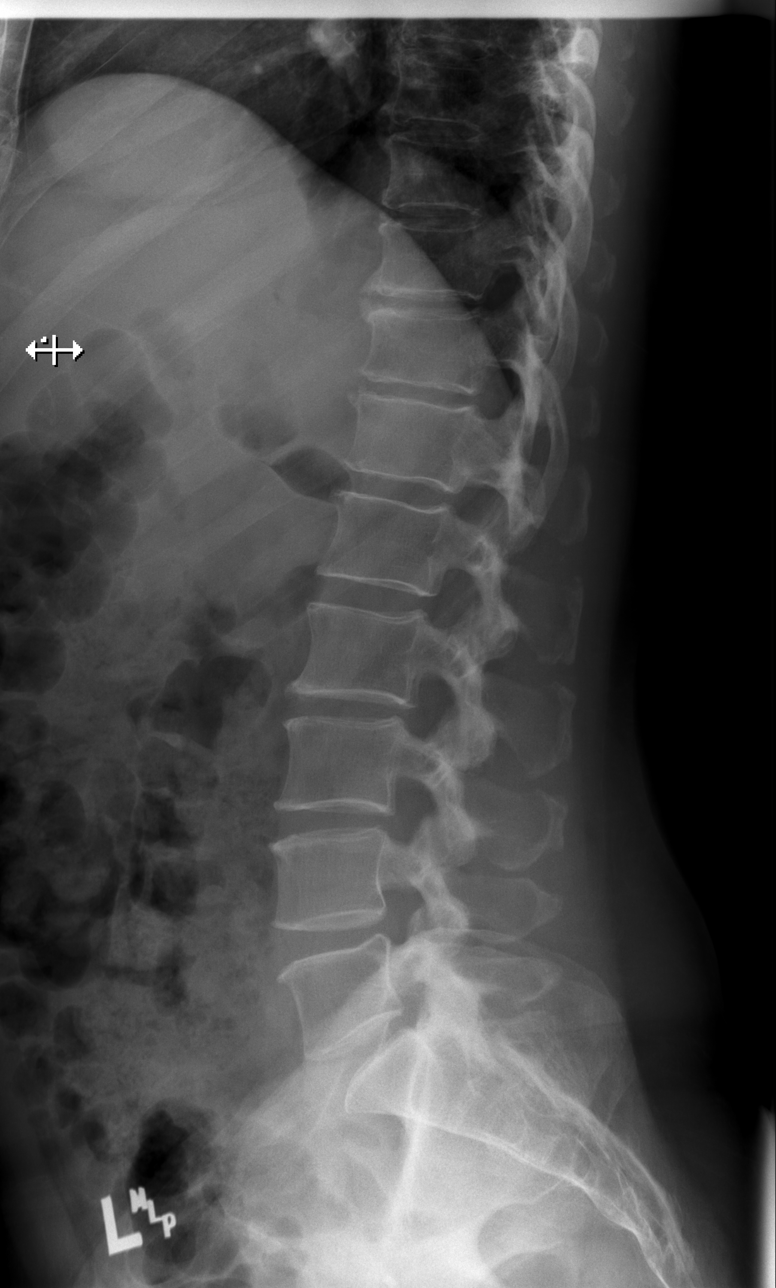

[t lumbar l-5 s-1 spot]
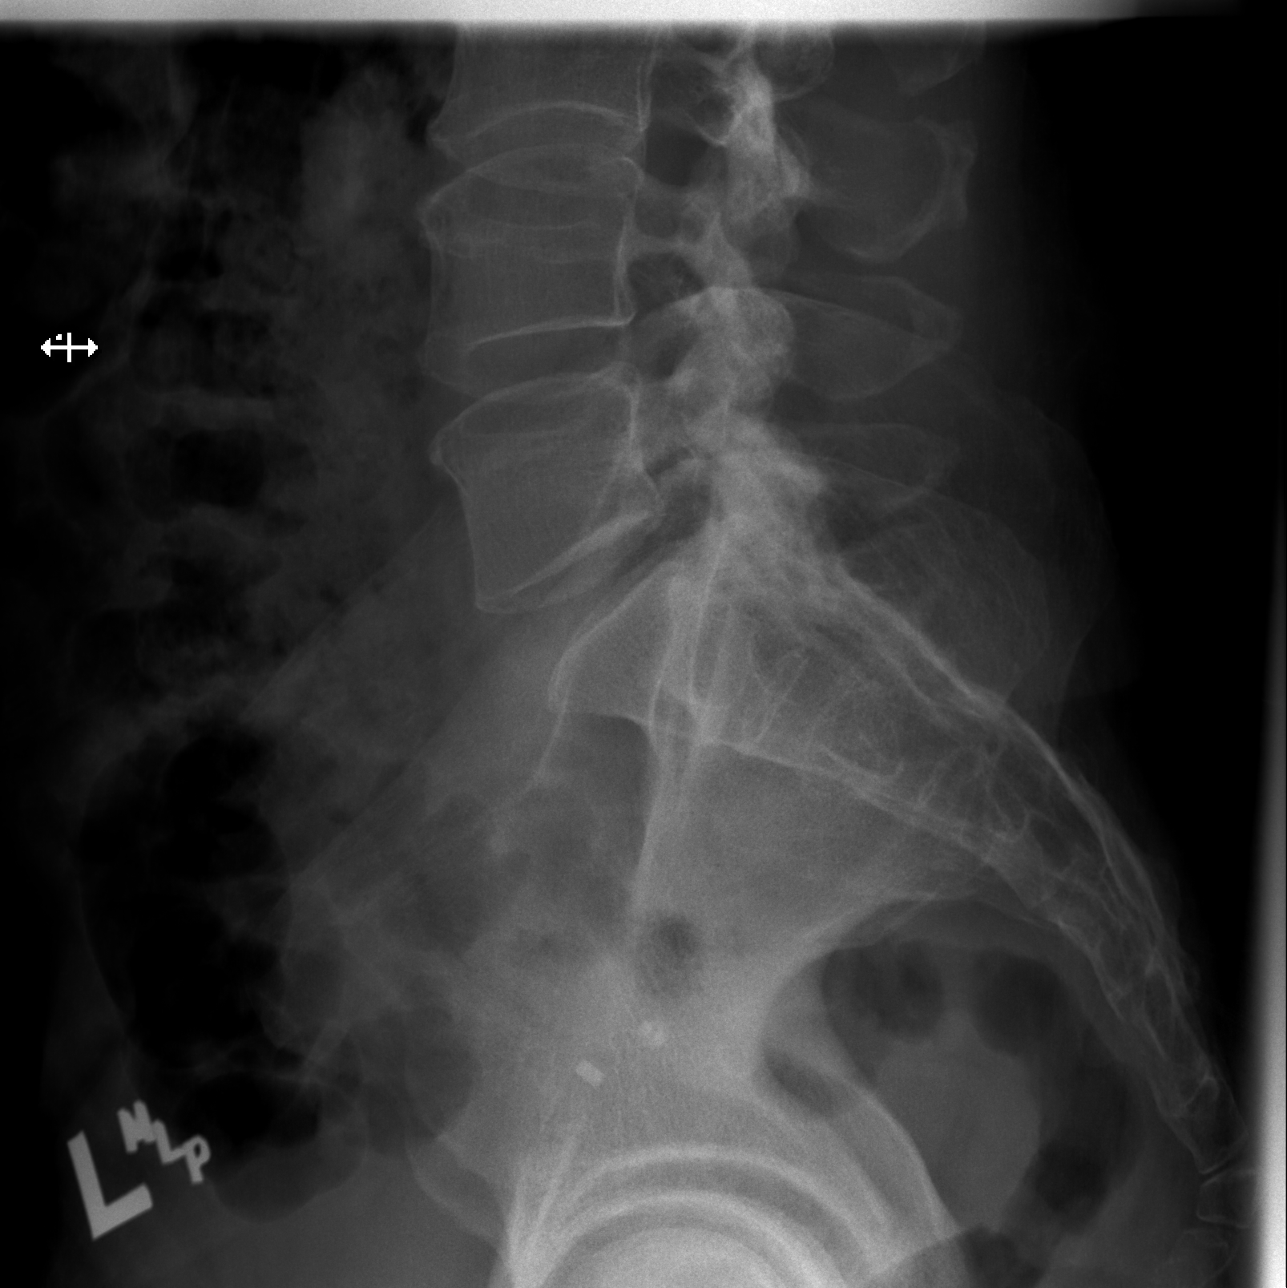

[5 of 5 positions shown; findings below may reference images not displayed]

FINDINGS: Five lumbar type vertebra. There is no acute fracture or subluxation
of the lumbar spine. The visualized posterior elements appear
intact. Mild multilevel degenerative changes and spurring. The
neural foramina appear patent as visualized. The soft tissues are
unremarkable. Bilateral tubal ligation clips noted.
IMPRESSION: Negative.

## 2023-06-23 IMAGING — DX DG ANKLE COMPLETE 3+V*L*
3 series · 3 of 3 positions shown · non-contrast
Comparison: None.

CLINICAL DATA: Recent slip and fall with ankle pain, initial
encounter

EXAM:
LEFT ANKLE COMPLETE - 3+ VIEW

[ankle ap]
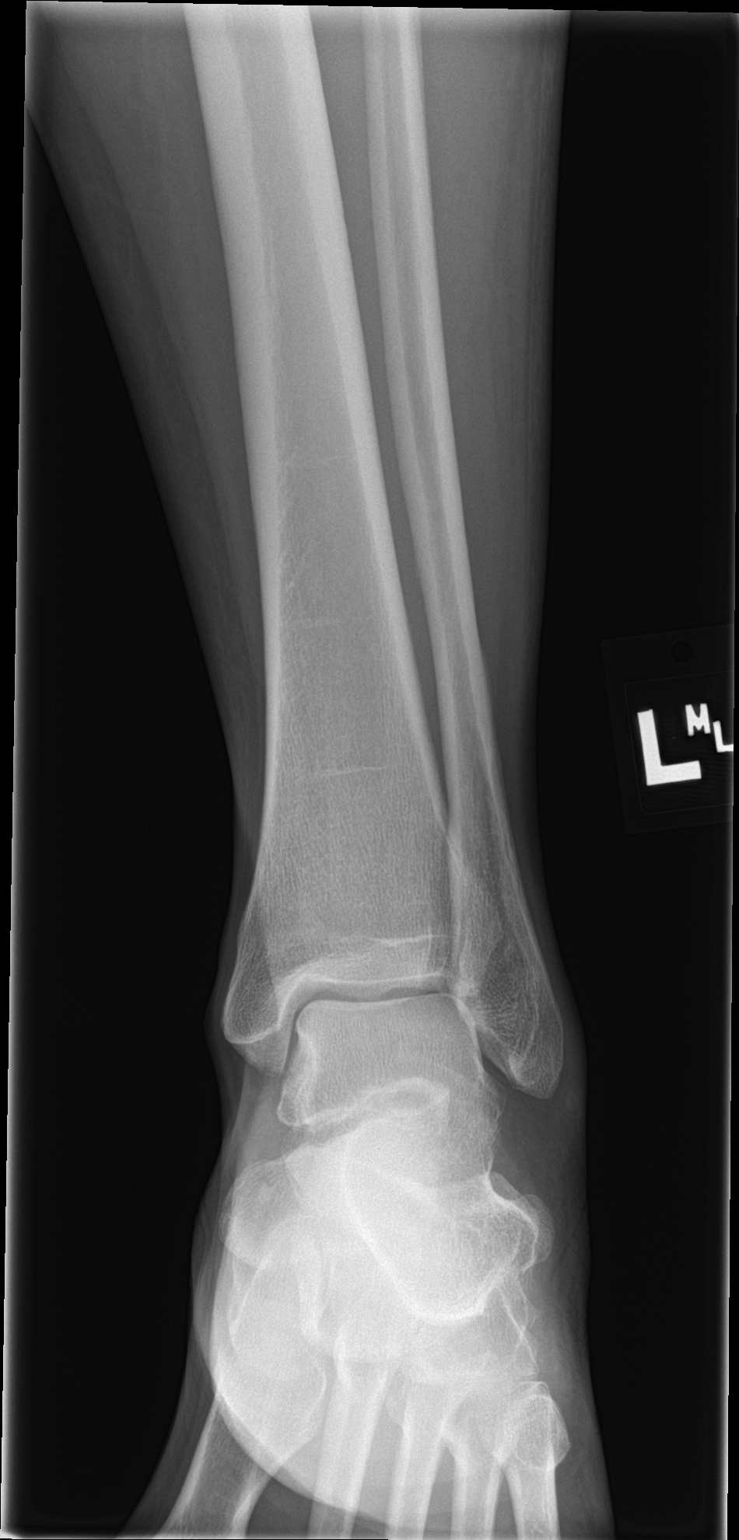

[ankle obl]
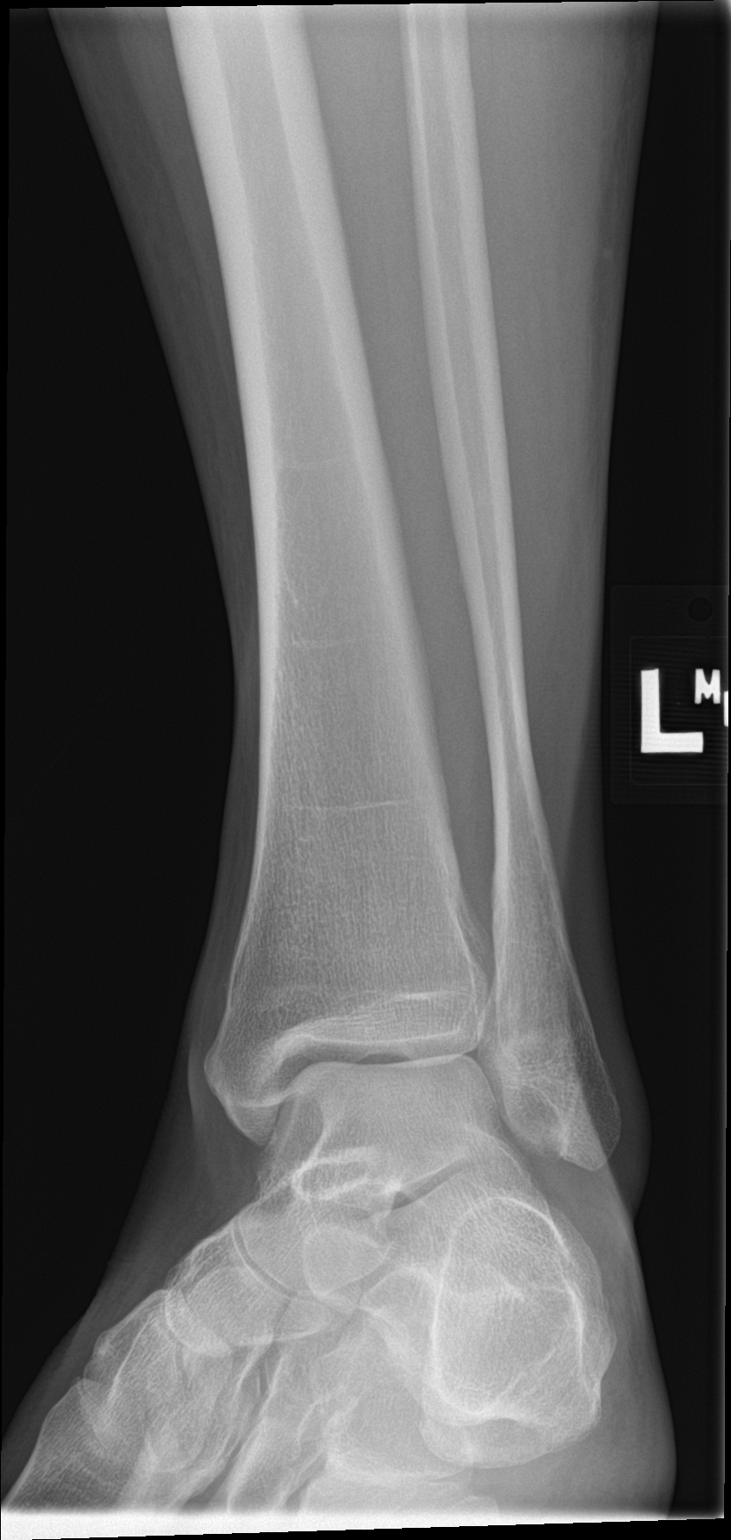

[ankle lat]
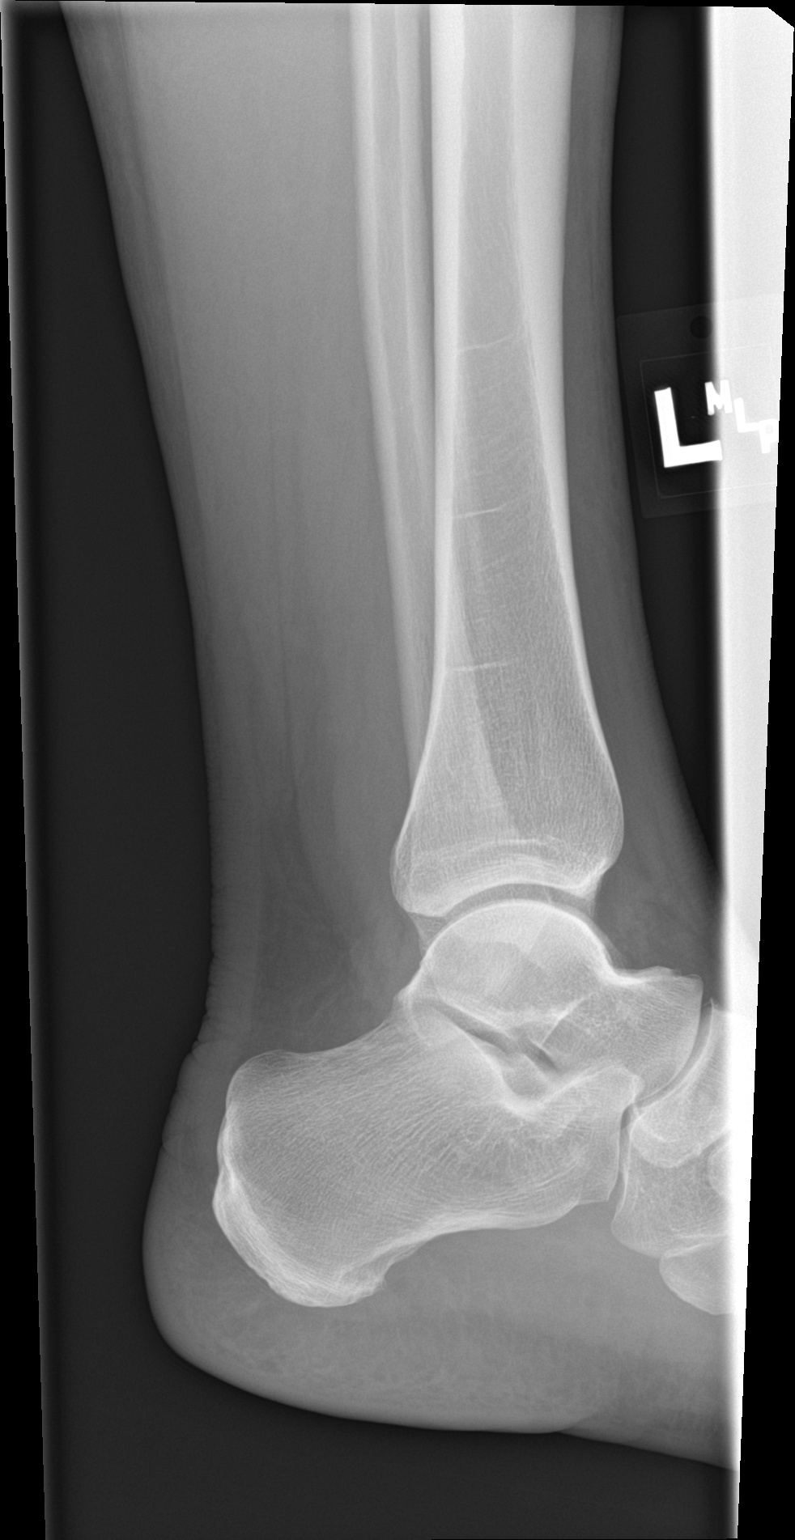

[3 of 3 positions shown; findings below may reference images not displayed]

FINDINGS: There is no evidence of fracture, dislocation, or joint effusion.
There is no evidence of arthropathy or other focal bone abnormality.
Soft tissues are unremarkable.
IMPRESSION: No acute abnormality noted.

## 2023-06-26 DIAGNOSIS — Z419 Encounter for procedure for purposes other than remedying health state, unspecified: Secondary | ICD-10-CM | POA: Diagnosis not present

## 2023-07-26 DIAGNOSIS — Z419 Encounter for procedure for purposes other than remedying health state, unspecified: Secondary | ICD-10-CM | POA: Diagnosis not present

## 2023-08-26 DIAGNOSIS — Z419 Encounter for procedure for purposes other than remedying health state, unspecified: Secondary | ICD-10-CM | POA: Diagnosis not present

## 2023-09-26 DIAGNOSIS — Z419 Encounter for procedure for purposes other than remedying health state, unspecified: Secondary | ICD-10-CM | POA: Diagnosis not present

## 2023-10-24 DIAGNOSIS — Z419 Encounter for procedure for purposes other than remedying health state, unspecified: Secondary | ICD-10-CM | POA: Diagnosis not present

## 2023-12-05 DIAGNOSIS — Z419 Encounter for procedure for purposes other than remedying health state, unspecified: Secondary | ICD-10-CM | POA: Diagnosis not present

## 2024-01-04 DIAGNOSIS — Z419 Encounter for procedure for purposes other than remedying health state, unspecified: Secondary | ICD-10-CM | POA: Diagnosis not present

## 2024-02-04 DIAGNOSIS — Z419 Encounter for procedure for purposes other than remedying health state, unspecified: Secondary | ICD-10-CM | POA: Diagnosis not present

## 2024-03-05 DIAGNOSIS — Z419 Encounter for procedure for purposes other than remedying health state, unspecified: Secondary | ICD-10-CM | POA: Diagnosis not present

## 2024-03-15 DIAGNOSIS — Z7689 Persons encountering health services in other specified circumstances: Secondary | ICD-10-CM | POA: Diagnosis not present

## 2024-03-15 DIAGNOSIS — F411 Generalized anxiety disorder: Secondary | ICD-10-CM | POA: Diagnosis not present

## 2024-04-05 DIAGNOSIS — Z419 Encounter for procedure for purposes other than remedying health state, unspecified: Secondary | ICD-10-CM | POA: Diagnosis not present

## 2024-05-06 DIAGNOSIS — Z419 Encounter for procedure for purposes other than remedying health state, unspecified: Secondary | ICD-10-CM | POA: Diagnosis not present
